# Patient Record
Sex: Female | Born: 1935 | Race: White | Hispanic: No | Smoking: Former smoker
Health system: Southern US, Community
[De-identification: ages and names within clinical notes are randomized; demographics above are authoritative.]

## PROBLEM LIST (undated history)

## (undated) DIAGNOSIS — I509 Heart failure, unspecified: Secondary | ICD-10-CM

## (undated) DIAGNOSIS — J8410 Pulmonary fibrosis, unspecified: Secondary | ICD-10-CM

## (undated) DIAGNOSIS — E119 Type 2 diabetes mellitus without complications: Secondary | ICD-10-CM

## (undated) DIAGNOSIS — I10 Essential (primary) hypertension: Secondary | ICD-10-CM

## (undated) DIAGNOSIS — E039 Hypothyroidism, unspecified: Secondary | ICD-10-CM

## (undated) HISTORY — PX: ABDOMINAL HYSTERECTOMY: SHX81

## (undated) HISTORY — PX: REPLACEMENT TOTAL KNEE: SUR1224

---

## 2017-01-10 ENCOUNTER — Inpatient Hospital Stay (HOSPITAL_COMMUNITY)
Admission: EM | Admit: 2017-01-10 | Discharge: 2017-01-12 | DRG: 196 | Disposition: A | Discharge status: Hospice | Payer: Medicare Other | Attending: Family Medicine | Admitting: Family Medicine

## 2017-01-10 ENCOUNTER — Encounter (HOSPITAL_COMMUNITY): Payer: Self-pay | Admitting: *Deleted

## 2017-01-10 ENCOUNTER — Emergency Department (HOSPITAL_COMMUNITY): Payer: Medicare Other

## 2017-01-10 DIAGNOSIS — J84112 Idiopathic pulmonary fibrosis: Principal | ICD-10-CM | POA: Diagnosis present

## 2017-01-10 DIAGNOSIS — Z79899 Other long term (current) drug therapy: Secondary | ICD-10-CM

## 2017-01-10 DIAGNOSIS — R0600 Dyspnea, unspecified: Secondary | ICD-10-CM | POA: Diagnosis not present

## 2017-01-10 DIAGNOSIS — E119 Type 2 diabetes mellitus without complications: Secondary | ICD-10-CM

## 2017-01-10 DIAGNOSIS — Z836 Family history of other diseases of the respiratory system: Secondary | ICD-10-CM | POA: Diagnosis not present

## 2017-01-10 DIAGNOSIS — Z515 Encounter for palliative care: Secondary | ICD-10-CM

## 2017-01-10 DIAGNOSIS — E1165 Type 2 diabetes mellitus with hyperglycemia: Secondary | ICD-10-CM | POA: Diagnosis present

## 2017-01-10 DIAGNOSIS — F419 Anxiety disorder, unspecified: Secondary | ICD-10-CM | POA: Diagnosis present

## 2017-01-10 DIAGNOSIS — I10 Essential (primary) hypertension: Secondary | ICD-10-CM | POA: Diagnosis present

## 2017-01-10 DIAGNOSIS — J8410 Pulmonary fibrosis, unspecified: Secondary | ICD-10-CM | POA: Diagnosis present

## 2017-01-10 DIAGNOSIS — J9621 Acute and chronic respiratory failure with hypoxia: Secondary | ICD-10-CM | POA: Diagnosis not present

## 2017-01-10 DIAGNOSIS — E039 Hypothyroidism, unspecified: Secondary | ICD-10-CM

## 2017-01-10 DIAGNOSIS — R0902 Hypoxemia: Secondary | ICD-10-CM | POA: Diagnosis not present

## 2017-01-10 DIAGNOSIS — Z7952 Long term (current) use of systemic steroids: Secondary | ICD-10-CM

## 2017-01-10 DIAGNOSIS — Z888 Allergy status to other drugs, medicaments and biological substances status: Secondary | ICD-10-CM

## 2017-01-10 DIAGNOSIS — Z9981 Dependence on supplemental oxygen: Secondary | ICD-10-CM

## 2017-01-10 DIAGNOSIS — I509 Heart failure, unspecified: Secondary | ICD-10-CM | POA: Diagnosis present

## 2017-01-10 DIAGNOSIS — I110 Hypertensive heart disease with heart failure: Secondary | ICD-10-CM | POA: Diagnosis present

## 2017-01-10 DIAGNOSIS — Z66 Do not resuscitate: Secondary | ICD-10-CM | POA: Diagnosis present

## 2017-01-10 HISTORY — DX: Type 2 diabetes mellitus without complications: E11.9

## 2017-01-10 HISTORY — DX: Pulmonary fibrosis, unspecified: J84.10

## 2017-01-10 HISTORY — DX: Hypothyroidism, unspecified: E03.9

## 2017-01-10 HISTORY — DX: Heart failure, unspecified: I50.9

## 2017-01-10 HISTORY — DX: Essential (primary) hypertension: I10

## 2017-01-10 LAB — COMPREHENSIVE METABOLIC PANEL
ALK PHOS: 64 U/L (ref 38–126)
ALT: 18 U/L (ref 14–54)
AST: 24 U/L (ref 15–41)
Albumin: 3.1 g/dL — ABNORMAL LOW (ref 3.5–5.0)
Anion gap: 10 (ref 5–15)
BILIRUBIN TOTAL: 0.4 mg/dL (ref 0.3–1.2)
BUN: 23 mg/dL — ABNORMAL HIGH (ref 6–20)
CALCIUM: 9.4 mg/dL (ref 8.9–10.3)
CO2: 22 mmol/L (ref 22–32)
CREATININE: 0.9 mg/dL (ref 0.44–1.00)
Chloride: 105 mmol/L (ref 101–111)
GFR calc Af Amer: >60 mL/min (ref >60)
GFR calc non Af Amer: 58 mL/min — ABNORMAL LOW (ref >60)
Glucose, Bld: 175 mg/dL — ABNORMAL HIGH (ref 65–99)
Potassium: 3.6 mmol/L (ref 3.5–5.1)
Sodium: 137 mmol/L (ref 135–145)
TOTAL PROTEIN: 7.4 g/dL (ref 6.5–8.1)

## 2017-01-10 LAB — CBC WITH DIFFERENTIAL/PLATELET
BASOS ABS: 0.1 10*3/uL (ref 0.0–0.1)
BASOS PCT: 0 %
EOS ABS: 0.7 10*3/uL (ref 0.0–0.7)
Eosinophils Relative: 5 %
HEMATOCRIT: 37.7 % (ref 36.0–46.0)
HEMOGLOBIN: 12.5 g/dL (ref 12.0–15.0)
Lymphocytes Relative: 13 %
Lymphs Abs: 1.8 10*3/uL (ref 0.7–4.0)
MCH: 30.3 pg (ref 26.0–34.0)
MCHC: 33.2 g/dL (ref 30.0–36.0)
MCV: 91.5 fL (ref 78.0–100.0)
MONO ABS: 1.2 10*3/uL — AB (ref 0.1–1.0)
MONOS PCT: 9 %
NEUTROS ABS: 9.8 10*3/uL — AB (ref 1.7–7.7)
NEUTROS PCT: 73 %
Platelets: 260 10*3/uL (ref 150–400)
RBC: 4.12 MIL/uL (ref 3.87–5.11)
RDW: 14.5 % (ref 11.5–15.5)
WBC: 13.5 10*3/uL — ABNORMAL HIGH (ref 4.0–10.5)

## 2017-01-10 LAB — GLUCOSE, CAPILLARY: Glucose-Capillary: 151 mg/dL — ABNORMAL HIGH (ref 65–99)

## 2017-01-10 MED ORDER — LATANOPROST 0.005 % OP SOLN
1.0000 [drp] | Freq: Every day | OPHTHALMIC | Status: DC
  Administered 2017-01-10 – 2017-01-11 (×2): 1 [drp] via OPHTHALMIC
  Filled 2017-01-10: qty 2.5

## 2017-01-10 MED ORDER — LEVOTHYROXINE SODIUM 50 MCG PO TABS
75.0000 ug | ORAL_TABLET | Freq: Every day | ORAL | Status: DC
  Administered 2017-01-11 – 2017-01-12 (×2): 75 ug via ORAL
  Filled 2017-01-10 (×2): qty 1

## 2017-01-10 MED ORDER — MOMETASONE FURO-FORMOTEROL FUM 200-5 MCG/ACT IN AERO
2.0000 | INHALATION_SPRAY | Freq: Two times a day (BID) | RESPIRATORY_TRACT | Status: DC
  Administered 2017-01-10 – 2017-01-12 (×4): 2 via RESPIRATORY_TRACT
  Filled 2017-01-10: qty 8.8

## 2017-01-10 MED ORDER — INSULIN ASPART 100 UNIT/ML ~~LOC~~ SOLN: 0.0000 [IU] | Freq: Every day | SUBCUTANEOUS | Status: DC

## 2017-01-10 MED ORDER — IPRATROPIUM-ALBUTEROL 0.5-2.5 (3) MG/3ML IN SOLN
3.0000 mL | RESPIRATORY_TRACT | Status: DC
  Administered 2017-01-10 – 2017-01-11 (×4): 3 mL via RESPIRATORY_TRACT
  Filled 2017-01-10 (×5): qty 3

## 2017-01-10 MED ORDER — ALLOPURINOL 100 MG PO TABS
100.0000 mg | ORAL_TABLET | Freq: Every day | ORAL | Status: DC
  Administered 2017-01-11 – 2017-01-12 (×2): 100 mg via ORAL
  Filled 2017-01-10 (×3): qty 1

## 2017-01-10 MED ORDER — MORPHINE SULFATE 10 MG/5ML PO SOLN
4.0000 mg | Freq: Once | ORAL | Status: AC
  Administered 2017-01-11: 4 mg via ORAL
  Filled 2017-01-10: qty 5

## 2017-01-10 MED ORDER — LEVOFLOXACIN 750 MG PO TABS
750.0000 mg | ORAL_TABLET | Freq: Once | ORAL | Status: AC
  Administered 2017-01-10: 750 mg via ORAL
  Filled 2017-01-10: qty 1

## 2017-01-10 MED ORDER — ACETAMINOPHEN 650 MG RE SUPP: 650.0000 mg | Freq: Four times a day (QID) | RECTAL | Status: DC | PRN

## 2017-01-10 MED ORDER — ONDANSETRON HCL 4 MG/2ML IJ SOLN: 4.0000 mg | Freq: Four times a day (QID) | INTRAMUSCULAR | Status: DC | PRN

## 2017-01-10 MED ORDER — LOSARTAN POTASSIUM 50 MG PO TABS
100.0000 mg | ORAL_TABLET | Freq: Every day | ORAL | Status: DC
  Administered 2017-01-11 – 2017-01-12 (×2): 100 mg via ORAL
  Filled 2017-01-10 (×2): qty 2

## 2017-01-10 MED ORDER — IOPAMIDOL (ISOVUE-370) INJECTION 76%
INTRAVENOUS | Status: AC
  Administered 2017-01-10: 100 mL via INTRAVENOUS
  Filled 2017-01-10: qty 100

## 2017-01-10 MED ORDER — SERTRALINE HCL 50 MG PO TABS
50.0000 mg | ORAL_TABLET | Freq: Every day | ORAL | Status: DC
  Administered 2017-01-11 – 2017-01-12 (×2): 50 mg via ORAL
  Filled 2017-01-10 (×2): qty 1

## 2017-01-10 MED ORDER — ONDANSETRON HCL 4 MG PO TABS: 4.0000 mg | ORAL_TABLET | Freq: Four times a day (QID) | ORAL | Status: DC | PRN

## 2017-01-10 MED ORDER — AMLODIPINE BESYLATE 5 MG PO TABS
5.0000 mg | ORAL_TABLET | Freq: Every day | ORAL | Status: DC
  Administered 2017-01-10 – 2017-01-12 (×3): 5 mg via ORAL
  Filled 2017-01-10 (×3): qty 1

## 2017-01-10 MED ORDER — LORAZEPAM 0.5 MG PO TABS
0.5000 mg | ORAL_TABLET | Freq: Four times a day (QID) | ORAL | Status: DC
  Administered 2017-01-10 – 2017-01-12 (×6): 0.5 mg via ORAL
  Filled 2017-01-10 (×7): qty 1

## 2017-01-10 MED ORDER — ALPRAZOLAM 0.5 MG PO TABS
0.5000 mg | ORAL_TABLET | Freq: Once | ORAL | Status: AC
  Administered 2017-01-10: 0.5 mg via ORAL
  Filled 2017-01-10: qty 1

## 2017-01-10 MED ORDER — PREDNISONE 20 MG PO TABS
60.0000 mg | ORAL_TABLET | Freq: Once | ORAL | Status: AC
  Administered 2017-01-10: 60 mg via ORAL
  Filled 2017-01-10: qty 3

## 2017-01-10 MED ORDER — TIMOLOL MALEATE 0.5 % OP SOLN
1.0000 [drp] | Freq: Two times a day (BID) | OPHTHALMIC | Status: DC
  Administered 2017-01-10 – 2017-01-12 (×4): 1 [drp] via OPHTHALMIC
  Filled 2017-01-10: qty 5

## 2017-01-10 MED ORDER — ACETAMINOPHEN 325 MG PO TABS: 650.0000 mg | ORAL_TABLET | Freq: Four times a day (QID) | ORAL | Status: DC | PRN

## 2017-01-10 MED ORDER — DOCUSATE SODIUM 100 MG PO CAPS
100.0000 mg | ORAL_CAPSULE | Freq: Two times a day (BID) | ORAL | Status: DC
  Filled 2017-01-10: qty 1

## 2017-01-10 MED ORDER — ENOXAPARIN SODIUM 40 MG/0.4ML ~~LOC~~ SOLN
40.0000 mg | Freq: Every day | SUBCUTANEOUS | Status: DC
  Administered 2017-01-10: 40 mg via SUBCUTANEOUS
  Filled 2017-01-10: qty 0.4

## 2017-01-10 MED ORDER — INSULIN ASPART 100 UNIT/ML ~~LOC~~ SOLN
0.0000 [IU] | Freq: Three times a day (TID) | SUBCUTANEOUS | Status: DC
  Administered 2017-01-11: 5 [IU] via SUBCUTANEOUS
  Administered 2017-01-11 (×2): 3 [IU] via SUBCUTANEOUS
  Administered 2017-01-12: 5 [IU] via SUBCUTANEOUS

## 2017-01-10 MED ORDER — METHYLPREDNISOLONE SODIUM SUCC 125 MG IJ SOLR
60.0000 mg | Freq: Two times a day (BID) | INTRAMUSCULAR | Status: DC
  Administered 2017-01-10 – 2017-01-12 (×4): 60 mg via INTRAVENOUS
  Filled 2017-01-10 (×4): qty 2

## 2017-01-10 MED ORDER — ALBUTEROL SULFATE (2.5 MG/3ML) 0.083% IN NEBU: 2.5000 mg | INHALATION_SOLUTION | RESPIRATORY_TRACT | Status: DC | PRN

## 2017-01-10 MED ORDER — LORAZEPAM 2 MG/ML IJ SOLN
1.0000 mg | INTRAMUSCULAR | Status: DC | PRN
  Administered 2017-01-12: 1 mg via INTRAVENOUS
  Filled 2017-01-10: qty 1

## 2017-01-10 NOTE — Assessment & Plan Note (Signed)
She gets significantly dyspneic even talking. Even at rest she is class IV dyspnea. I will give her a test dose of morphine 4 mg syrup. She is completely on board with palliative care and possibly going to hospice. She is fully aware of the terminal nature of this disease especially after having lost her daughter to the same disease.

## 2017-01-10 NOTE — Consult Note (Signed)
PULMONARY / CRITICAL CARE MEDICINE   Name: Janet Cooke MRN: [id number] DOB: 1932/03/30 PCP No PCP Per Patient LOS 0 as of 01/10/2017     ADMISSION DATE:  01/10/2017 CONSULTATION DATE:  01/10/17  REFERRING MD:  Dr Victorino Dike [facility]  CHIEF COMPLAINT:  IPF, cjhronc hypoxemic resp failure  HISTORY OF PRESENT ILLNESS:   83 year old lady who has recently moved from [place] for worsening pulmonary fibrosis so that she can be with her daughter Janet Cooke and get the care she needs. History is obtained from review of the chart, outside records from [facility] provided by the daughter and by the patient herself.  Patient has a strong family history of pulmonary fibrosis. Her brother who is still alive has pulmonary fibrosis. Her daughter Janet Cooke date of birth 05/06/1957 was under the care of Dr. Shan Levans and deceased in 02/13/13 from pulmonary fibrosis. Patient believes the daughter had IPF but review of the chart shows that she had DIP although it is not clear if the Alnisa Hasley had a biopsy or not.  Patient Janet Cooke herself was given a diagnosis of IPF according to her history in November 2000 610. She does not recollect any biopsy. She does not recollect any autoimmune panel. She is a remote smoker having quit 20 years ago. She was initially started on Ofev but this gave her bad diarrhea and she stopped this by the spring of 2017. Subsequently it was a follow-up 2017-02-13 she was started on Pirfenidone (Esbriet) but she had to discontinue this in March 2018 because of side effects related to the GI system. At the time of diagnosis she was on as needed oxygen and requiring 2 L of oxygen continuously as of one year ago but this has progressed and requiring 4-5 liters of oxygen as of the last 1 month prior to move from [place] to [facility].  She has upcoming first consultation appointment with Dr. Marchelle Gearing for IPF care on 02/04/2017 but in the interim has been hospitalized  because of progressive dyspnea and class IV dyspnea and significant hypoxemia with desaturations to the 70s despite 5 L of oxygen which is the maximum of oxygen at home. This no fever edema or chills or chest pain. She and her daughter primarily interested in palliation and genetic support and counseling. Of note she has participated in some kind of her genetic study in [place] not otherwise specified.  PAST MEDICAL HISTORY :  She  has a past medical history of CHF (congestive heart failure) (HCC); Diabetes mellitus without complication (HCC); Hypertension; Hypothyroidism; and Pulmonary fibrosis (HCC).  PAST SURGICAL HISTORY: She  has a past surgical history that includes Replacement total knee and Abdominal hysterectomy.  Allergies  Allergen Reactions  . Lipitor [Atorvastatin] Other (See Comments)    Muscle pain  . Lisinopril     Cough    No current facility-administered medications on file prior to encounter.    No current outpatient prescriptions on file prior to encounter.    FAMILY HISTORY:  Her indicated that her brother is alive. She indicated that her daughter is deceased.    SOCIAL HISTORY: She  reports that she has quit smoking. Her smoking use included Cigarettes. She has a 35.00 pack-year smoking history. She has quit using smokeless tobacco. She reports that she drinks alcohol. She reports that she does not use drugs.  REVIEW OF SYSTEMS:   This is detailed in the history of present illness otherwise not elicitable due to significant dyspnea for  the patient   VITAL SIGNS: BP (!) (P) 130/56 (BP Location: Left Arm)   Pulse (P) 66   Temp (P) 97.6 F (36.4 C) (Oral)   Resp (!) (P) 24   Ht  (1.6 m)   Wt 68 kg (150 lb)   SpO2 94%   BMI 26.57 kg/m   HEMODYNAMICS:    VENTILATOR SETTINGS:    INTAKE / OUTPUT: No intake/output data recorded.     EXAM  General Appearance:    Looks Chronically unwell   Head:    Normocephalic, without obvious abnormality,  atraumatic  Eyes:    PERRL - Yes, conjunctiva/corneas - clear      Ears:    Normal external ear canals, both ears  Nose:   NG tube - No   Throat:  ETT TUBE - No , OG tube - no   Neck:   Supple,  No enlargement/tenderness/nodules     Lungs:     Class IV dyspnea on talking. Nasal cannula oxygen on. Quite tachypneic which gets worse with talking. Rapid shallow breathing. Unable to complete sentences   Chest wall:    No deformity  Heart:    S1 and S2 normal, no murmur, CVP - No.  Pressors - no   Abdomen:     Soft, no masses, no organomegaly  Genitalia:    Not done  Rectal:   not done  Extremities:   Extremities- No cyanosis, no clubbing no edema      Skin:   Intact in exposed areas . Sacral area - No reports of sacral decub      Neurologic:   Sedation - None -> RASS - +1 . Moves all 4s - yes. CAM-ICU - negative for delirium . Orientation - 3+        LABS  PULMONARY No results for input(s): PHART, PCO2ART, PO2ART, HCO3, TCO2, O2SAT in the last 168 hours.  Invalid input(s): PCO2, PO2  CBC  Recent Labs Lab 01/10/17 1637  HGB 12.5  HCT 37.7  WBC 13.5*  PLT 260    COAGULATION No results for input(s): INR in the last 168 hours.  CARDIAC  No results for input(s): TROPONINI in the last 168 hours. No results for input(s): PROBNP in the last 168 hours.   CHEMISTRY  Recent Labs Lab 01/10/17 1637  NA 137  K 3.6  CL 105  CO2 22  GLUCOSE 175*  BUN 23*  CREATININE 0.90  CALCIUM 9.4   Estimated Creatinine Clearance: 44.6 mL/min (by C-G formula based on SCr of 0.9 mg/dL).   LIVER  Recent Labs Lab 01/10/17 1637  AST 24  ALT 18  ALKPHOS 64  BILITOT 0.4  PROT 7.4  ALBUMIN 3.1*     INFECTIOUS No results for input(s): LATICACIDVEN, PROCALCITON in the last 168 hours.   ENDOCRINE CBG (last 3)   Recent Labs  01/10/17 2221  GLUCAP 151*         IMAGING x48h  - image(s) personally visualized  -   highlighted in bold Dg Chest 2 View  Result Date:  01/10/2017 CLINICAL DATA:  Shortness of Breath EXAM: CHEST  2 VIEW COMPARISON:  None. FINDINGS: Cardiac shadow is at the upper limits of normal in size. Diffuse fibrotic changes are identified bilaterally likely of a chronic nature and consistent with the patient's given clinical history. No definitive consolidation to suggest acute infiltrate is noted. No bony abnormality is seen. IMPRESSION: Diffuse fibrotic changes which are likely chronic given the patient's clinical  history. Electronically Signed   By: Alcide Clever M.D.   On: 01/10/2017 16:46   Ct Angio Chest Pe W And/or Wo Contrast  Result Date: 01/10/2017 CLINICAL DATA:  Shortness of breath for 2 years, pulmonary fibrosis, COPD EXAM: CT ANGIOGRAPHY CHEST WITH CONTRAST TECHNIQUE: Multidetector CT imaging of the chest was performed using the standard protocol during bolus administration of intravenous contrast. Multiplanar CT image reconstructions and MIPs were obtained to evaluate the vascular anatomy. CONTRAST:  100 cc Isovue COMPARISON:  None. FINDINGS: Cardiovascular: Cardiomegaly is noted. No pericardial effusion. No pulmonary embolus is noted. There is no aortic aneurysm. Atherosclerotic calcifications of thoracic aorta and coronary arteries. Small hiatal hernia. Mediastinum/Nodes: A precarinal lymph node Measures 1.1 cm short-axis. AP window lymph node measures 1.2 cm short-axis. Left anterior mediastinal lymph node just lateral to pulmonary artery measures 1 cm short-axis. Right hilar lymph node Measures 1 cm short-axis. Left hilar lymph node Measures 1 cm short-axis. These are borderline enlarged by size criteria probable reactive. Central airways are patent. Lungs/Pleura: There is bilateral chronic interstitial lung disease with significant cystic changes and peribronchovascular interstitial thickening. Peripheral fibrotic changes with honeycombing are noted bilateral upper lobes and lower lobes. There is thickening of interlobular septa bilateral  peripheral. No definite superimposed infiltrate or pulmonary edema. Findings probable due to chronic UIP and fibrotic changes. Upper Abdomen: The visualized upper abdomen shows no adrenal gland mass. Visualized upper kidneys are unremarkable. Liver and spleen are unremarkable. Musculoskeletal: No destructive bony lesions are noted. Sagittal images of the spine are mild degenerative changes mid and lower thoracic spine Review of the MIP images confirms the above findings. IMPRESSION: 1. No pulmonary embolus is noted. 2. Probable chronic extensive interstitial lung disease bilaterally. Bilateral cystic and fibrotic changes. No definite superimposed acute infiltrate or pulmonary edema. 3. There is bilateral borderline mediastinal and hilar adenopathy probable reactive in nature. No definite pathologic lymph nodes are noted. 4. Cardiomegaly is noted. Atherosclerotic calcifications of thoracic aorta and coronary arteries. Electronically Signed   By: Natasha Mead M.D.   On: 01/10/2017 19:43       ASSESSMENT and PLAN  Acute on chronic respiratory failure with hypoxemia (HCC) I do not think the worsening is due to IPF flare. On the other hand I think this is due to progressive IPF. At this point in time supportive oxygen therapy but we are going to reach limits of a much oxygen we can deliver. I think we should look at symptoms and just except low pulse oximetry goal say greater than 75 or 80% as much as she can tolerate  Dyspnea She gets significantly dyspneic even talking. Even at rest she is class IV dyspnea. I will give her a test dose of morphine 4 mg syrup. She is completely on board with palliative care and possibly going to hospice. She is fully aware of the terminal nature of this disease especially after having lost her daughter to the same disease.  IPF (idiopathic pulmonary fibrosis) (HCC) She has failed both Pirfenidone (Esbriet) and Ofev. The diseases and at end-stage. To me the CT looks classic  IPF and based on the age and family history this is definite of IPF. For completion sake I will get limited autoimmune profile of ANA, rheumatoid factor and CCP. Regardless the prognosis is less than 6 months and she is hospice eligible  She is very interested in participating in research trials. I will ask pulmonary research coordinator Carron Curie to evaluate her for the [facility] IPF registry  noninterventional trial. Patient and her daughter are very interested in this.  Family history of pulmonary fibrosis I have restarted [name] genetics counselor within [place] to see if he can help daughter Janet Cooke with IPF genetics.. Meanwhile daughter will try to obtain information about the genetic test that was done in [place] for the patient      FAMILY  - Updates: 01/10/2017 --> daughter and the patient at the bedside  - Inter-disciplinary family meet or Palliative Care meeting due by:  DAy 7. Current LOS is LOS 0 days  CODE STATUS    Code Status Orders        Start     Ordered   01/10/17 2144  Do not attempt resuscitation (DNR)  Continuous    Question Answer Comment  In the event of cardiac or respiratory ARREST Do not call a "code blue"   In the event of cardiac or respiratory ARREST Do not perform Intubation, CPR, defibrillation or ACLS   In the event of cardiac or respiratory ARREST Use medication by any route, position, wound care, and other measures to relive pain and suffering. May use oxygen, suction and manual treatment of airway obstruction as needed for comfort.      01/10/17 2143    Code Status History    Date Active Date Inactive Code Status Order ID Comments User Context   01/10/2017  5:13 PM 01/10/2017  9:43 PM DNR [id number]  Marily Memos, MD ED    Advance Directive Documentation     Most Recent Value  Type of Advance Directive  Healthcare Power of Attorney, Living will  Pre-existing out of facility DNR order (yellow form or pink MOST form)  -  "MOST" Form in  Place?  -        DISPO Medical floor      Dr. Kalman Shan, M.D., [facility].C.P Pulmonary and Critical Care Medicine Staff Physician [facility] Pager: [phone], If no answer or between  15:00h - 7:00h: call [phone]  01/10/2017 11:25 PM

## 2017-01-10 NOTE — Assessment & Plan Note (Signed)
I have restarted Janet Cooke genetics counselor within  to see if he can help daughter Janet Cooke with IPF genetics.. Meanwhile daughter will try to obtain information about the genetic test that was done in [place] for the patient

## 2017-01-10 NOTE — ED Provider Notes (Signed)
[facility]-EMERGENCY DEPT Provider Note   CSN: [id number] Arrival date & time: 01/10/17  1550     History   Chief Complaint Chief Complaint  Patient presents with  . Shortness of Breath    HPI Janet Cooke is a 83 y.o. female.   Shortness of Breath  This is a new problem. The problem occurs rarely.The problem has not changed since onset.Associated symptoms include cough. Pertinent negatives include no fever, no headaches, no hemoptysis and no wheezing. The problem's precipitants include pollens.    Past Medical History:  Diagnosis Date  . CHF (congestive heart failure) (HCC)    uncertain if systolic or diastolic  . Diabetes mellitus without complication (HCC)   . Hypertension   . Hypothyroidism   . Pulmonary fibrosis [facility])     Patient Active Problem List   Diagnosis Date Noted  . Palliative care by specialist   . DNR (do not resuscitate)   . Diabetes mellitus type 2 in nonobese (HCC) 01/10/2017  . Essential hypertension 01/10/2017  . Hypothyroidism 01/10/2017  . Acute on chronic respiratory failure with hypoxemia (HCC) 01/10/2017  . IPF (idiopathic pulmonary fibrosis) (HCC) 01/10/2017  . Dyspnea 01/10/2017  . Family history of pulmonary fibrosis 01/10/2017    Past Surgical History:  Procedure Laterality Date  . ABDOMINAL HYSTERECTOMY    . REPLACEMENT TOTAL KNEE      OB History    No data available       Home Medications    Prior to Admission medications   Medication Sig Start Date End Date Taking? Authorizing Provider  albuterol (VENTOLIN HFA) 108 (90 Base) MCG/ACT inhaler Inhale 2 puffs into the lungs every 4 (four) hours as needed for wheezing.   Yes Historical Provider, MD  allopurinol (ZYLOPRIM) 100 MG tablet Take 100 mg by mouth daily.   Yes Historical Provider, MD  amLODipine (NORVASC) 5 MG tablet Take 5 mg by mouth daily.   Yes Historical Provider, MD  budesonide-formoterol (SYMBICORT) 160-4.5 MCG/ACT inhaler Inhale 2 puffs into the lungs 2 (two)  times daily.   Yes Historical Provider, MD  Cholecalciferol (VITAMIN D3) 2000 units TABS Take 2,000 Units by mouth daily.   Yes Historical Provider, MD  furosemide (LASIX) 20 MG tablet Take 20 mg by mouth.   Yes Historical Provider, MD  glimepiride (AMARYL) 4 MG tablet Take 4 mg by mouth daily before breakfast.   Yes Historical Provider, MD  latanoprost (XALATAN) 0.005 % ophthalmic solution Place 1 drop into both eyes at bedtime.   Yes Historical Provider, MD  levothyroxine (SYNTHROID, LEVOTHROID) 75 MCG tablet Take 75 mcg by mouth daily before breakfast.   Yes Historical Provider, MD  linagliptin (TRADJENTA) 5 MG TABS tablet Take 5 mg by mouth daily. With food   Yes Historical Provider, MD  losartan (COZAAR) 100 MG tablet Take 100 mg by mouth daily.   Yes Historical Provider, MD  sertraline (ZOLOFT) 50 MG tablet Take 50 mg by mouth daily.   Yes Historical Provider, MD  timolol (BETIMOL) 0.5 % ophthalmic solution Apply 1 drop to eye 2 (two) times daily.   Yes Historical Provider, MD  tiotropium (SPIRIVA) 18 MCG inhalation capsule Place 18 mcg into inhaler and inhale daily.   Yes Historical Provider, MD  vitamin B-12 (CYANOCOBALAMIN) 500 MCG tablet Take 500 mcg by mouth daily.   Yes Historical Provider, MD    Family History Family History  Problem Relation Age of Onset  . Pulmonary fibrosis Brother     on [place]  .  Pulmonary fibrosis Daughter 12    Social History Social History  Substance Use Topics  . Smoking status: Former Smoker    Packs/day: 1.00    Years: 35.00    Types: Cigarettes  . Smokeless tobacco: Former Neurosurgeon  . Alcohol use Yes     Comment: very little     Allergies   Lipitor [atorvastatin] and Lisinopril   Review of Systems Review of Systems  Constitutional: Negative for fever.  Respiratory: Positive for cough and shortness of breath. Negative for hemoptysis and wheezing.   Neurological: Negative for headaches.  All other systems reviewed and are  negative.    Physical Exam Updated Vital Signs BP 118/74 (BP Location: Right Arm)   Pulse (!) 57   Temp 97.4 F (36.3 C) (Oral)   Resp 18   Ht  (1.6 m)   Wt 150 lb (68 kg)   SpO2 93%   BMI 26.57 kg/m   Physical Exam  Constitutional: She is oriented to person, place, and time. She appears well-developed and well-nourished.  HENT:  Head: Normocephalic and atraumatic.  Eyes: Conjunctivae and EOM are normal.  Neck: Normal range of motion.  Cardiovascular: Normal rate and regular rhythm.   Pulmonary/Chest: Effort normal. No stridor. Tachypnea noted. No respiratory distress. She has no wheezes. She has rales (mild diffuse).  Abdominal: Soft. She exhibits no distension.  Musculoskeletal: Normal range of motion. She exhibits no edema or deformity.  Neurological: She is alert and oriented to person, place, and time. No cranial nerve deficit. Coordination normal.  Skin: Skin is warm and dry.  Nursing note and vitals reviewed.    ED Treatments / Results  Labs (all labs ordered are listed, but only abnormal results are displayed) Labs Reviewed  COMPREHENSIVE METABOLIC PANEL - Abnormal; Notable for the following:       Result Value   Glucose, Bld 175 (*)    BUN 23 (*)    Albumin 3.1 (*)    GFR calc non Af Amer 58 (*)    All other components within normal limits  CBC WITH DIFFERENTIAL/PLATELET - Abnormal; Notable for the following:    WBC 13.5 (*)    Neutro Abs 9.8 (*)    Monocytes Absolute 1.2 (*)    All other components within normal limits  BASIC METABOLIC PANEL - Abnormal; Notable for the following:    Glucose, Bld 219 (*)    BUN 23 (*)    GFR calc non Af Amer 51 (*)    GFR calc Af Amer 59 (*)    All other components within normal limits  CBC - Abnormal; Notable for the following:    RBC 3.64 (*)    Hemoglobin 10.6 (*)    HCT 32.7 (*)    All other components within normal limits  GLUCOSE, CAPILLARY - Abnormal; Notable for the following:    Glucose-Capillary 151  (*)    All other components within normal limits  GLUCOSE, CAPILLARY - Abnormal; Notable for the following:    Glucose-Capillary 215 (*)    All other components within normal limits  GLUCOSE, CAPILLARY - Abnormal; Notable for the following:    Glucose-Capillary 185 (*)    All other components within normal limits  ANTINUCLEAR ANTIBODIES, IFA  RHEUMATOID FACTOR  CYCLIC CITRUL PEPTIDE ANTIBODY, IGG/IGA    EKG  EKG Interpretation  Date/Time:  Sunday January 10 2017 16:18:10 EDT Ventricular Rate:  94 PR Interval:    QRS Duration: 103 QT Interval:  361 QTC Calculation:  452 R Axis:   -61 Text Interpretation:  Sinus rhythm Multiple premature complexes, vent & supraven Left anterior fascicular block Abnormal R-wave progression, late transition Confirmed by [facility] MD, Barbara Cower [phone]) on 01/10/2017 6:38:21 PM       Radiology Dg Chest 2 View  Result Date: 01/10/2017 CLINICAL DATA:  Shortness of Breath EXAM: CHEST  2 VIEW COMPARISON:  None. FINDINGS: Cardiac shadow is at the upper limits of normal in size. Diffuse fibrotic changes are identified bilaterally likely of a chronic nature and consistent with the patient's given clinical history. No definitive consolidation to suggest acute infiltrate is noted. No bony abnormality is seen. IMPRESSION: Diffuse fibrotic changes which are likely chronic given the patient's clinical history. Electronically Signed   By: Alcide Clever M.D.   On: 01/10/2017 16:46   Ct Angio Chest Pe W And/or Wo Contrast  Result Date: 01/10/2017 CLINICAL DATA:  Shortness of breath for 2 years, pulmonary fibrosis, COPD EXAM: CT ANGIOGRAPHY CHEST WITH CONTRAST TECHNIQUE: Multidetector CT imaging of the chest was performed using the standard protocol during bolus administration of intravenous contrast. Multiplanar CT image reconstructions and MIPs were obtained to evaluate the vascular anatomy. CONTRAST:  100 cc Isovue COMPARISON:  None. FINDINGS: Cardiovascular: Cardiomegaly is  noted. No pericardial effusion. No pulmonary embolus is noted. There is no aortic aneurysm. Atherosclerotic calcifications of thoracic aorta and coronary arteries. Small hiatal hernia. Mediastinum/Nodes: A precarinal lymph node Measures 1.1 cm short-axis. AP window lymph node measures 1.2 cm short-axis. Left anterior mediastinal lymph node just lateral to pulmonary artery measures 1 cm short-axis. Right hilar lymph node Measures 1 cm short-axis. Left hilar lymph node Measures 1 cm short-axis. These are borderline enlarged by size criteria probable reactive. Central airways are patent. Lungs/Pleura: There is bilateral chronic interstitial lung disease with significant cystic changes and peribronchovascular interstitial thickening. Peripheral fibrotic changes with honeycombing are noted bilateral upper lobes and lower lobes. There is thickening of interlobular septa bilateral peripheral. No definite superimposed infiltrate or pulmonary edema. Findings probable due to chronic UIP and fibrotic changes. Upper Abdomen: The visualized upper abdomen shows no adrenal gland mass. Visualized upper kidneys are unremarkable. Liver and spleen are unremarkable. Musculoskeletal: No destructive bony lesions are noted. Sagittal images of the spine are mild degenerative changes mid and lower thoracic spine Review of the MIP images confirms the above findings. IMPRESSION: 1. No pulmonary embolus is noted. 2. Probable chronic extensive interstitial lung disease bilaterally. Bilateral cystic and fibrotic changes. No definite superimposed acute infiltrate or pulmonary edema. 3. There is bilateral borderline mediastinal and hilar adenopathy probable reactive in nature. No definite pathologic lymph nodes are noted. 4. Cardiomegaly is noted. Atherosclerotic calcifications of thoracic aorta and coronary arteries. Electronically Signed   By: Natasha Mead M.D.   On: 01/10/2017 19:43    Procedures Procedures (including critical care  time)  Medications Ordered in ED Medications  ipratropium-albuterol (DUONEB) 0.5-2.5 (3) MG/3ML nebulizer solution 3 mL (3 mLs Nebulization Not Given 01/11/17 1600)  allopurinol (ZYLOPRIM) tablet 100 mg (100 mg Oral Given 01/11/17 1028)  amLODipine (NORVASC) tablet 5 mg (5 mg Oral Given 01/11/17 1029)  mometasone-formoterol (DULERA) 200-5 MCG/ACT inhaler 2 puff (2 puffs Inhalation Given 01/11/17 0854)  latanoprost (XALATAN) 0.005 % ophthalmic solution 1 drop (1 drop Both Eyes Given 01/10/17 2304)  levothyroxine (SYNTHROID, LEVOTHROID) tablet 75 mcg (75 mcg Oral Given 01/11/17 0750)  losartan (COZAAR) tablet 100 mg (100 mg Oral Given 01/11/17 1031)  sertraline (ZOLOFT) tablet 50 mg (50 mg Oral Given 01/11/17  1033)  timolol (TIMOPTIC) 0.5 % ophthalmic solution 1 drop (1 drop Both Eyes Given 01/11/17 1034)  acetaminophen (TYLENOL) tablet 650 mg (not administered)    Or  acetaminophen (TYLENOL) suppository 650 mg (not administered)  docusate sodium (COLACE) capsule 100 mg (100 mg Oral Not Given 01/11/17 1029)  ondansetron (ZOFRAN) tablet 4 mg (not administered)    Or  ondansetron (ZOFRAN) injection 4 mg (not administered)  insulin aspart (novoLOG) injection 0-15 Units (3 Units Subcutaneous Given 01/11/17 1300)  insulin aspart (novoLOG) injection 0-5 Units (0 Units Subcutaneous Not Given 01/10/17 2234)  methylPREDNISolone sodium succinate (SOLU-MEDROL) 125 mg/2 mL injection 60 mg (60 mg Intravenous Given 01/11/17 1035)  albuterol (PROVENTIL) (2.5 MG/3ML) 0.083% nebulizer solution 2.5 mg (not administered)  LORazepam (ATIVAN) tablet 0.5 mg (0.5 mg Oral Given 01/11/17 1532)  LORazepam (ATIVAN) injection 1 mg (not administered)  morphine CONCENTRATE 10 MG/0.5ML oral solution 5 mg (not administered)  levofloxacin (LEVAQUIN) tablet 750 mg (750 mg Oral Given 01/10/17 1640)  predniSONE (DELTASONE) tablet 60 mg (60 mg Oral Given 01/10/17 1640)  ALPRAZolam (XANAX) tablet 0.5 mg (0.5 mg Oral Given 01/10/17 1723)   iopamidol (ISOVUE-370) 76 % injection (100 mLs Intravenous Contrast Given 01/10/17 1852)  morphine 10 MG/5ML solution 4 mg (4 mg Oral Given 01/11/17 0010)  morphine CONCENTRATE 10 MG/0.5ML oral solution 5 mg (5 mg Oral Given 01/11/17 1052)     Initial Impression / Assessment and Plan / ED Course  I have reviewed the triage vital signs and the nursing notes.  Pertinent labs & imaging results that were available during my care of the patient were reviewed by me and considered in my medical decision making (see chart for details).     Acute on chronic respiratory failure likely secondary to IBS. Patient is willing to pursue palliative care was not able to turn her oxygen up past 5 L on her current machine. She patient get a new machine later in the week but is too hypoxic on the one that she has. She'll desat into the 80s with ambulation and takes 3045 minutes to get back above 90 and become comfortable again. Secondary to these issues I will discuss with hospitalist about admission for positive care consult and to help ensure the patient has a safe discharge plan.  Final Clinical Impressions(s) / ED Diagnoses   Final diagnoses:  Hypoxia  Pulmonary fibrosis (HCC)      Marily Memos, MD 01/11/17 [phone]

## 2017-01-10 NOTE — H&P (Signed)
History and Physical    Janet Cooke:[id number] DOB: December 24, 1931 DOA: 01/10/2017  PCP: No PCP Per Patient - PCP is in [place], unsure if she will be staying here in [place] or returning to [place] Consultants:  Pulmonologist and cardiologist in [place] Patient coming from: visiting daughter; Janet Cooke: daughter, [phone]  Chief Complaint: SOB  HPI: Janet Cooke is a 83 y.o. female with medical history significant of end-stage pulmonary fibrosis, HTN, DM, and CHF (no further information about systolic/diastolic) presenting with SOB.  Patient with pulmonary fibrosis x 2 years.  She had been doing well with supplemental O2 prn, mostly at night.  Flew up to [place], [place] at Thanksgiving - had some SOB but seemed to catch up again.  In January, she began feeling SOB chronically and was deteriorating, needing more O2.  Increased from 2L to 3L in February, now at 4L.  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  Her breathing was so labored that her daughter felt like they had to come to the ER.  +cough, productive of clear sputum.  Unsure if she is ill or if this is a progression.  Daughter died of this 4 years ago and her brother has it as well but is doing well "on some fancy drug."  Currently on 8L via [place].  Breathing treatments do seem to be helping.    Feb 28, daughter was with her in [facility] - difficulty catching her breath, sats in 72s.  Hospitalized x 3 days, then rehab x 30 days.  Discharged March 30.  Able to walk 12 feet at discharge.  After discharge, she seemed to be deteriorating fast and could do less daily.  Unable to move from bed to commode without dropping O2 sats 20 points.  Taking too long to try to get O2 back up - 30 minutes to get O2 back up from 70s.  Daughter realized that she was unable to live on her own in this siutation and needs to take care of her, and there is no family in [place].  So daughter brought her to [facility].  Started on Ofev in Nov 2016 and unable to  tolerate it, so violently ill.  Has appointment with Dr. Marchelle Gearing on May 10.   ED Course: Given Prednisone, Levaquin, Xanax, Duoneb  Review of Systems: As per HPI; otherwise review of systems reviewed and negative.   Ambulatory Status:  Only transfers  Past Medical History:  Diagnosis Date  . CHF (congestive heart failure) (HCC)    uncertain if systolic or diastolic  . Diabetes mellitus without complication (HCC)   . Hypertension   . Hypothyroidism   . Pulmonary fibrosis (HCC)     Past Surgical History:  Procedure Laterality Date  . ABDOMINAL HYSTERECTOMY    . REPLACEMENT TOTAL KNEE      Social History   Social History  . Marital status: Widowed    Spouse name: N/A  . Number of children: N/A  . Years of education: N/A   Occupational History  . retired    Social History Main Topics  . Smoking status: Former Smoker    Packs/day: 1.00    Years: 35.00    Types: Cigarettes  . Smokeless tobacco: Former Neurosurgeon  . Alcohol use Yes     Comment: very little  . Drug use: No  . Sexual activity: Not on file   Other Topics Concern  . Not on file   Social History Narrative  .  No narrative on file    Allergies  Allergen Reactions  . Lipitor [Atorvastatin] Other (See Comments)    Muscle pain  . Lisinopril     Cough    Family History  Problem Relation Age of Onset  . Pulmonary fibrosis Brother     on Esbriet  . Pulmonary fibrosis Daughter 43    Prior to Admission medications   Medication Sig Start Date End Date Taking? Authorizing Provider  albuterol (VENTOLIN HFA) 108 (90 Base) MCG/ACT inhaler Inhale 2 puffs into the lungs every 4 (four) hours as needed for wheezing.   Yes Historical Provider, MD  allopurinol (ZYLOPRIM) 100 MG tablet Take 100 mg by mouth daily.   Yes Historical Provider, MD  amLODipine (NORVASC) 5 MG tablet Take 5 mg by mouth daily.   Yes Historical Provider, MD  budesonide-formoterol (SYMBICORT) 160-4.5 MCG/ACT inhaler Inhale 2 puffs into the  lungs 2 (two) times daily.   Yes Historical Provider, MD  Cholecalciferol (VITAMIN D3) 2000 units TABS Take 2,000 Units by mouth daily.   Yes Historical Provider, MD  furosemide (LASIX) 20 MG tablet Take 20 mg by mouth.   Yes Historical Provider, MD  glimepiride (AMARYL) 4 MG tablet Take 4 mg by mouth daily before breakfast.   Yes Historical Provider, MD  latanoprost (XALATAN) 0.005 % ophthalmic solution Place 1 drop into both eyes at bedtime.   Yes Historical Provider, MD  levothyroxine (SYNTHROID, LEVOTHROID) 75 MCG tablet Take 75 mcg by mouth daily before breakfast.   Yes Historical Provider, MD  linagliptin (TRADJENTA) 5 MG TABS tablet Take 5 mg by mouth daily. With food   Yes Historical Provider, MD  losartan (COZAAR) 100 MG tablet Take 100 mg by mouth daily.   Yes Historical Provider, MD  sertraline (ZOLOFT) 50 MG tablet Take 50 mg by mouth daily.   Yes Historical Provider, MD  timolol (BETIMOL) 0.5 % ophthalmic solution Apply 1 drop to eye 2 (two) times daily.   Yes Historical Provider, MD  tiotropium (SPIRIVA) 18 MCG inhalation capsule Place 18 mcg into inhaler and inhale daily.   Yes Historical Provider, MD  vitamin B-12 (CYANOCOBALAMIN) 500 MCG tablet Take 500 mcg by mouth daily.   Yes Historical Provider, MD    Physical Exam: Vitals:   01/10/17 2035 01/10/17 2100 01/10/17 2158 01/10/17 2247  BP: (!) 134/46 132/76 (!) (P) 130/56   Pulse: 71 70 (P) 66   Resp: (!) 23 (!) 21 (!) (P) 24   Temp:   (P) 97.6 F (36.4 C)   TempSrc:   (P) Oral   SpO2: 96% 96% 99% 94%  Weight:      Height:         General:  Appears short of breath with tachypnea Eyes:  PERRL, EOMI, normal lids, iris ENT:  grossly normal hearing, lips & tongue, mmm Neck:  no LAD, masses or thyromegaly Cardiovascular:  RRR, no m/r/g. 1-2+ LE edema.  Respiratory:  CTA bilaterally, no w/r/r.  Abdomen: soft, ntnd, NABS Skin:  no rash or induration seen on limited exam Musculoskeletal:  grossly normal tone BUE/BLE,  good ROM, no bony abnormality Psychiatric:  grossly normal mood and affect, speech fluent and appropriate, AOx3 Neurologic:  CN 2-12 grossly intact, moves all extremities in coordinated fashion, sensation intact  Labs on Admission: I have personally reviewed following labs and imaging studies  CBC:  Recent Labs Lab 01/10/17 1637  WBC 13.5*  NEUTROABS 9.8*  HGB 12.5  HCT 37.7  MCV 91.5  PLT  260   Basic Metabolic Panel:  Recent Labs Lab 01/10/17 1637  NA 137  K 3.6  CL 105  CO2 22  GLUCOSE 175*  BUN 23*  CREATININE 0.90  CALCIUM 9.4   GFR: Estimated Creatinine Clearance: 44.6 mL/min (by C-G formula based on SCr of 0.9 mg/dL). Liver Function Tests:  Recent Labs Lab 01/10/17 1637  AST 24  ALT 18  ALKPHOS 64  BILITOT 0.4  PROT 7.4  ALBUMIN 3.1*   No results for input(s): LIPASE, AMYLASE in the last 168 hours. No results for input(s): AMMONIA in the last 168 hours. Coagulation Profile: No results for input(s): INR, PROTIME in the last 168 hours. Cardiac Enzymes: No results for input(s): CKTOTAL, CKMB, CKMBINDEX, TROPONINI in the last 168 hours. BNP (last 3 results) No results for input(s): PROBNP in the last 8760 hours. HbA1C: No results for input(s): HGBA1C in the last 72 hours. CBG:  Recent Labs Lab 01/10/17 2221  GLUCAP 151*   Lipid Profile: No results for input(s): CHOL, HDL, LDLCALC, TRIG, CHOLHDL, LDLDIRECT in the last 72 hours. Thyroid Function Tests: No results for input(s): TSH, T4TOTAL, FREET4, T3FREE, THYROIDAB in the last 72 hours. Anemia Panel: No results for input(s): VITAMINB12, FOLATE, FERRITIN, TIBC, IRON, RETICCTPCT in the last 72 hours. Urine analysis: No results found for: COLORURINE, APPEARANCEUR, LABSPEC, PHURINE, GLUCOSEU, HGBUR, BILIRUBINUR, KETONESUR, PROTEINUR, UROBILINOGEN, NITRITE, LEUKOCYTESUR  Creatinine Clearance: Estimated Creatinine Clearance: 44.6 mL/min (by C-G formula based on SCr of 0.9 mg/dL).  Sepsis  Labs: (procalcitonin:4,lacticidven:4) )No results found for this or any previous visit (from the past 240 hour(s)).   Radiological Exams on Admission: Dg Chest 2 View  Result Date: 01/10/2017 CLINICAL DATA:  Shortness of Breath EXAM: CHEST  2 VIEW COMPARISON:  None. FINDINGS: Cardiac shadow is at the upper limits of normal in size. Diffuse fibrotic changes are identified bilaterally likely of a chronic nature and consistent with the patient's given clinical history. No definitive consolidation to suggest acute infiltrate is noted. No bony abnormality is seen. IMPRESSION: Diffuse fibrotic changes which are likely chronic given the patient's clinical history. Electronically Signed   By: Alcide Clever M.D.   On: 01/10/2017 16:46   Ct Angio Chest Pe W And/or Wo Contrast  Result Date: 01/10/2017 CLINICAL DATA:  Shortness of breath for 2 years, pulmonary fibrosis, COPD EXAM: CT ANGIOGRAPHY CHEST WITH CONTRAST TECHNIQUE: Multidetector CT imaging of the chest was performed using the standard protocol during bolus administration of intravenous contrast. Multiplanar CT image reconstructions and MIPs were obtained to evaluate the vascular anatomy. CONTRAST:  100 cc Isovue COMPARISON:  None. FINDINGS: Cardiovascular: Cardiomegaly is noted. No pericardial effusion. No pulmonary embolus is noted. There is no aortic aneurysm. Atherosclerotic calcifications of thoracic aorta and coronary arteries. Small hiatal hernia. Mediastinum/Nodes: A precarinal lymph node Measures 1.1 cm short-axis. AP window lymph node measures 1.2 cm short-axis. Left anterior mediastinal lymph node just lateral to pulmonary artery measures 1 cm short-axis. Right hilar lymph node Measures 1 cm short-axis. Left hilar lymph node Measures 1 cm short-axis. These are borderline enlarged by size criteria probable reactive. Central airways are patent. Lungs/Pleura: There is bilateral chronic interstitial lung disease with significant cystic  changes and peribronchovascular interstitial thickening. Peripheral fibrotic changes with honeycombing are noted bilateral upper lobes and lower lobes. There is thickening of interlobular septa bilateral peripheral. No definite superimposed infiltrate or pulmonary edema. Findings probable due to chronic UIP and fibrotic changes. Upper Abdomen: The visualized upper abdomen shows no adrenal gland mass. Visualized  upper kidneys are unremarkable. Liver and spleen are unremarkable. Musculoskeletal: No destructive bony lesions are noted. Sagittal images of the spine are mild degenerative changes mid and lower thoracic spine Review of the MIP images confirms the above findings. IMPRESSION: 1. No pulmonary embolus is noted. 2. Probable chronic extensive interstitial lung disease bilaterally. Bilateral cystic and fibrotic changes. No definite superimposed acute infiltrate or pulmonary edema. 3. There is bilateral borderline mediastinal and hilar adenopathy probable reactive in nature. No definite pathologic lymph nodes are noted. 4. Cardiomegaly is noted. Atherosclerotic calcifications of thoracic aorta and coronary arteries. Electronically Signed   By: Natasha Mead M.D.   On: 01/10/2017 19:43    EKG: Independently reviewed.  NSR with rate 94; PVCs, LAFB, no evidence of acute ischemia  Assessment/Plan Principal Problem:   Acute on chronic respiratory failure with hypoxia (HCC) Active Problems:   Diabetes mellitus type 2 in nonobese [facility])   Essential hypertension   Pulmonary fibrosis (HCC)   Hypothyroidism   Acute on chronic respiratory failure -Patient with known pulmonary fibrosis, appears to be end-stage and rapidly progressive at this point -Previously tried medications and unable to tolerate side effects -A geneticist is reviewing her family's history since she also had a daughter who died of this and she has a brother currently with it as well -Her daughter brought her to [place] last week due to concern  about her mother living alone in [place] -She has an appointment with Dr. Marchelle Gearing in May but would benefit from inpatient consultation in the AM to see if anything else can be done; Dr. Marchelle Gearing happened to be here tonight and so he will see here now -Has home O2 but her need is now >5L, which is the most her current system will provide -Will observe for pulm consultation and case management consultation -Continue high-flow [place] O2, but patient may require BIPAP if this is insufficient -She is DNR/DNI -She requests a palliative care consult -She denies apparent respiratory infection, fever, etc so will not currently continue to treat with antibiotics (although she did receive Levaquin in the ER and this should provide 24 hours of coverage anyway) -Will continue steroids (Solumedrol 60 mg IV BID) -Will place foley because patient is unable to move about at all; she requests this -Continue Duonebs for comfort, so hold Spiriva  DM -Glucose 175 -Hold oral meds, cover with SSI -Anticipate some worsening of hyperglycemia with initiation of steroids  HTN -Continue Norvasc, Cozaar  Hypothyroidism -Will continue Synthroid but not check TSH due to patient's likely poor prognosis  DVT prophylaxis: Lovenox Code Status:  DNR - confirmed with patient/family Family Communication: Daughter present throughout evaluation  Disposition Plan:  Home once clinically improved Consults called: Pulmonology  Admission status: It is my clinical opinion that referral for OBSERVATION is reasonable and necessary in this patient based on the above information provided. The aforementioned taken together are felt to place the patient at high risk for further clinical deterioration. However it is anticipated that the patient may be medically stable for discharge from the hospital within 24 to 48 hours.    Jonah Blue MD [facility]  If 7PM-7AM, please contact night-coverage [email or web address] Password  [facility]  01/10/2017, 10:58 PM

## 2017-01-10 NOTE — ED Notes (Signed)
Attempted report 

## 2017-01-10 NOTE — ED Notes (Signed)
Bed: ZO10 Expected date:  Expected time:  Means of arrival:  Comments: Ems  Shob

## 2017-01-10 NOTE — ED Notes (Signed)
Patient transported to X-ray 

## 2017-01-10 NOTE — ED Notes (Signed)
ED Provider at bedside. 

## 2017-01-10 NOTE — Assessment & Plan Note (Addendum)
She has failed both Pirfenidone (Esbriet) and Ofev. The diseases and at end-stage. To me the CT looks classic IPF and based on the age and family history this is definite of IPF. For completion sake I will get limited autoimmune profile of ANA, rheumatoid factor and CCP. Regardless the prognosis is less than 6 months and she is hospice eligible  She is very interested in participating in research trials. I will ask pulmonary research coordinator Carron Curie to evaluate her for the [facility] IPF registry noninterventional trial. Patient and her daughter are very interested in this.

## 2017-01-10 NOTE — Assessment & Plan Note (Signed)
I do not think the worsening is due to IPF flare. On the other hand I think this is due to progressive IPF. At this point in time supportive oxygen therapy but we are going to reach limits of a much oxygen we can deliver. I think we should look at symptoms and just except low pulse oximetry goal say greater than 75 or 80% as much as she can tolerate

## 2017-01-10 NOTE — ED Triage Notes (Signed)
Pt arrives via EMS from home (lives with her daughter, [place]) with c/o shortness of breath. Per ems report, pt had tachypnea and hypoxic on EMS arrival to the seen. She uses an oxygen concentrator at home. Hx of idiopathic pulmonary fibrosis. Pt took sertraline x 1 just PTA

## 2017-01-10 NOTE — ED Notes (Signed)
Admitting Provider at bedside. 

## 2017-01-11 ENCOUNTER — Telehealth: Payer: Self-pay | Admitting: Internal Medicine

## 2017-01-11 DIAGNOSIS — Z515 Encounter for palliative care: Secondary | ICD-10-CM | POA: Diagnosis present

## 2017-01-11 DIAGNOSIS — J84112 Idiopathic pulmonary fibrosis: Secondary | ICD-10-CM | POA: Diagnosis present

## 2017-01-11 DIAGNOSIS — Z888 Allergy status to other drugs, medicaments and biological substances status: Secondary | ICD-10-CM | POA: Diagnosis not present

## 2017-01-11 DIAGNOSIS — E1165 Type 2 diabetes mellitus with hyperglycemia: Secondary | ICD-10-CM | POA: Diagnosis present

## 2017-01-11 DIAGNOSIS — J9621 Acute and chronic respiratory failure with hypoxia: Secondary | ICD-10-CM | POA: Diagnosis present

## 2017-01-11 DIAGNOSIS — I110 Hypertensive heart disease with heart failure: Secondary | ICD-10-CM | POA: Diagnosis present

## 2017-01-11 DIAGNOSIS — F419 Anxiety disorder, unspecified: Secondary | ICD-10-CM | POA: Diagnosis present

## 2017-01-11 DIAGNOSIS — E119 Type 2 diabetes mellitus without complications: Secondary | ICD-10-CM

## 2017-01-11 DIAGNOSIS — E039 Hypothyroidism, unspecified: Secondary | ICD-10-CM | POA: Diagnosis present

## 2017-01-11 DIAGNOSIS — R0902 Hypoxemia: Secondary | ICD-10-CM | POA: Diagnosis present

## 2017-01-11 DIAGNOSIS — Z66 Do not resuscitate: Secondary | ICD-10-CM | POA: Diagnosis present

## 2017-01-11 DIAGNOSIS — Z7952 Long term (current) use of systemic steroids: Secondary | ICD-10-CM | POA: Diagnosis not present

## 2017-01-11 DIAGNOSIS — Z9981 Dependence on supplemental oxygen: Secondary | ICD-10-CM | POA: Diagnosis not present

## 2017-01-11 DIAGNOSIS — R0609 Other forms of dyspnea: Secondary | ICD-10-CM | POA: Diagnosis not present

## 2017-01-11 DIAGNOSIS — Z79899 Other long term (current) drug therapy: Secondary | ICD-10-CM | POA: Diagnosis not present

## 2017-01-11 DIAGNOSIS — Z006 Encounter for examination for normal comparison and control in clinical research program: Secondary | ICD-10-CM

## 2017-01-11 DIAGNOSIS — I509 Heart failure, unspecified: Secondary | ICD-10-CM | POA: Diagnosis present

## 2017-01-11 LAB — BASIC METABOLIC PANEL
ANION GAP: 11 (ref 5–15)
BUN: 23 mg/dL — ABNORMAL HIGH (ref 6–20)
CALCIUM: 8.9 mg/dL (ref 8.9–10.3)
CO2: 22 mmol/L (ref 22–32)
Chloride: 102 mmol/L (ref 101–111)
Creatinine, Ser: 1 mg/dL (ref 0.44–1.00)
GFR, EST AFRICAN AMERICAN: 59 mL/min — AB (ref >60)
GFR, EST NON AFRICAN AMERICAN: 51 mL/min — AB (ref >60)
GLUCOSE: 219 mg/dL — AB (ref 65–99)
POTASSIUM: 4.3 mmol/L (ref 3.5–5.1)
Sodium: 135 mmol/L (ref 135–145)

## 2017-01-11 LAB — GLUCOSE, CAPILLARY
GLUCOSE-CAPILLARY: 164 mg/dL — AB (ref 65–99)
Glucose-Capillary: 215 mg/dL — ABNORMAL HIGH (ref 65–99)
Glucose-Capillary: 185 mg/dL — ABNORMAL HIGH (ref 65–99)
Glucose-Capillary: 178 mg/dL — ABNORMAL HIGH (ref 65–99)

## 2017-01-11 LAB — CBC
HEMATOCRIT: 32.7 % — AB (ref 36.0–46.0)
HEMOGLOBIN: 10.6 g/dL — AB (ref 12.0–15.0)
MCH: 29.1 pg (ref 26.0–34.0)
MCHC: 32.4 g/dL (ref 30.0–36.0)
MCV: 89.8 fL (ref 78.0–100.0)
Platelets: 226 10*3/uL (ref 150–400)
RBC: 3.64 MIL/uL — AB (ref 3.87–5.11)
RDW: 14.4 % (ref 11.5–15.5)
WBC: 7.2 10*3/uL (ref 4.0–10.5)

## 2017-01-11 MED ORDER — MORPHINE SULFATE (CONCENTRATE) 10 MG/0.5ML PO SOLN: 5.0000 mg | ORAL | Status: DC | PRN

## 2017-01-11 MED ORDER — MORPHINE SULFATE (CONCENTRATE) 10 MG/0.5ML PO SOLN
5.0000 mg | ORAL | Status: AC
  Administered 2017-01-11: 5 mg via ORAL
  Filled 2017-01-11: qty 0.5

## 2017-01-11 NOTE — Telephone Encounter (Signed)
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   Plan  1. Please call and refer her daughter Janet Cooke - ?last name but the patient to be referred will be Janet Cooke the daughter to [name] of genetics.  [phone].  [name], MS, CGC [facility] [address] [place], [place] [address]  2. You can tell daughter if they go hospice, I can be hospice attending  3. Keep upcoming appt 02/04/17  Thanks  Dr. Kalman Shan, M.D., F.C.C.P Pulmonary and Critical Care Medicine Staff Physician [facility] Pager: [phone], If no answer or between  15:00h - 7:00h: call [phone]  01/11/2017 11:39 AM

## 2017-01-11 NOTE — Progress Notes (Signed)
IPF PRO Registry Purpose: To collect data and biological samples that will support future research studies.  Registry will describe current approaches to diagnosis and treatment of IPF, analyze participant characteristics to describe the natural history of the disease, assess quality of life, describe participants interactions with the health care system, describe IPF treatment practices across multiple institutions, and utilize biological samples linked to well characterized IPF participants to identify disease biomarkers.  --------------------------------------------------------------------------------------------------- Clinical Research Coordinator / Research RN note : This visit for Subject Janet Cooke with DOB: [birth year] on 01/11/2017 for the above protocol is Visit and is for purpose of research. The consent for this encounter is under Protocol Version Amendment 2 [id number] and is currently IRB approved. Subject expressed continued interest and consent in continuing as a study subject. Subject confirmed that there was no  change in contact information (e.g. address, telephone, email). Subject thanked for participation in research and contribution to science.   Refer to subjects paper source binder for the informed consent documentation checklist and patient questionnaires.  Signed by,  Carron Curie, CMA,Clinical Research Coordinator [facility]  [place], [place] 5:34 PM 01/11/2017

## 2017-01-11 NOTE — Consult Note (Signed)
Consultation Note Date: 01/11/2017   Patient Name: Janet Cooke  DOB: 01-06-1932  MRN: [id number]  Age / Sex: 83 y.o., female  PCP: No Pcp Per Patient Referring Physician: Tyrone Nine, MD  Reason for Consultation: Establishing goals of care, Non pain symptom management and Psychosocial/spiritual support  HPI/Patient Profile: 83 y.o. female  admitted on 01/10/2017 with medical history significant of end-stage pulmonary fibrosis, HTN, DM, and CHF presenting to ER with SOB.    Patient with pulmonary fibrosis x 2 years.  She had been doing well with supplemental O2 prn, mostly at night.  In January, she began feeling SOB chronically and was deteriorating, needing more O2.  Increased from 2L to 3L in February, now at 4L. Started on Ofev in Nov 2016 and unable to tolerate it, so violently ill.  Recently moved here from [place] to be closer to daughter 2/2 to continued physical and functional decline.   Dr. Marchelle Gearing is her pulmonologist .  She is currently on  8L via [place]. Pateitn and family face advanced directive decisions and anticipatory care needs  Daughter died of IPF 4 years ago.      Clinical Assessment and Goals of Care:   This NP Lorinda Creed reviewed medical records, received report from team, assessed the patient and then meet at the patient's bedside along with her daughter/ Faye Ramsay to discuss diagnosis, prognosis, GOC, EOL wishes disposition and options.  A detailed discussion was had today regarding advanced directives.  Concepts specific to code status, artifical feeding and hydration, continued IV antibiotics and rehospitalization was had.  The difference between a aggressive medical intervention path  and a palliative comfort care path for this patient at this time was had.  Values and goals of care important to patient and family were attempted to be elicited.              MOST form  introduced  Concept of Hospice and Palliative Care were discussed    Natural trajectory and expectations at EOL were discussed.  Questions and concerns addressed.   Family encouraged to call with questions or concerns.  PMT will continue to support holistically.    SUMMARY OF RECOMMENDATIONS     Patient has made decision to focus on comfort and discharge home with hospice services   Code Status/Advance Care Planning:  DNR   Symptom Management:   Dyspnea: Roxanol 5 mg po/sl every 2 hrs prn  Palliative Prophylaxis:   Bowel Regimen and Oral Care, treat dyspnea  Additional Recommendations (Limitations, Scope, Preferences):  Full Comfort Care  Psycho-social/Spiritual:    Additional Recommendations: Education on Hospice  Prognosis:   < 6 months  Discharge Planning: Discussed with Dr Jarvis Newcomer, will treat symptoms over the next 24 hours and home with hospice in the morning..  Will need transport   Home with Hospice      Primary Diagnoses: Present on Admission: . (Resolved) Acute on chronic respiratory failure with hypoxia (HCC) . Essential hypertension . (Resolved) Pulmonary fibrosis (HCC)   I have  reviewed the medical record, interviewed the patient and family, and examined the patient. The following aspects are pertinent.  Past Medical History:  Diagnosis Date  . CHF (congestive heart failure) (HCC)    uncertain if systolic or diastolic  . Diabetes mellitus without complication (HCC)   . Hypertension   . Hypothyroidism   . Pulmonary fibrosis [facility])    Social History   Social History  . Marital status: Widowed    Spouse name: N/A  . Number of children: N/A  . Years of education: N/A   Occupational History  . retired    Social History Main Topics  . Smoking status: Former Smoker    Packs/day: 1.00    Years: 35.00    Types: Cigarettes  . Smokeless tobacco: Former Neurosurgeon  . Alcohol use Yes     Comment: very little  . Drug use: No  . Sexual activity: Not  Asked   Other Topics Concern  . None   Social History Narrative  . None   Family History  Problem Relation Age of Onset  . Pulmonary fibrosis Brother     on Esbriet  . Pulmonary fibrosis Daughter 59   Scheduled Meds: . allopurinol  100 mg Oral Daily  . amLODipine  5 mg Oral Daily  . docusate sodium  100 mg Oral BID  . enoxaparin (LOVENOX) injection  40 mg Subcutaneous QHS  . insulin aspart  0-15 Units Subcutaneous TID WC  . insulin aspart  0-5 Units Subcutaneous QHS  . ipratropium-albuterol  3 mL Nebulization Q4H  . latanoprost  1 drop Both Eyes QHS  . levothyroxine  75 mcg Oral QAC breakfast  . LORazepam  0.5 mg Oral Q6H  . losartan  100 mg Oral Daily  . methylPREDNISolone (SOLU-MEDROL) injection  60 mg Intravenous Q12H  . mometasone-formoterol  2 puff Inhalation BID  . sertraline  50 mg Oral Daily  . timolol  1 drop Both Eyes BID   Continuous Infusions: PRN Meds:.acetaminophen **OR** acetaminophen, albuterol, LORazepam, ondansetron **OR** ondansetron (ZOFRAN) IV Medications Prior to Admission:  Prior to Admission medications   Medication Sig Start Date End Date Taking? Authorizing Provider  albuterol (VENTOLIN HFA) 108 (90 Base) MCG/ACT inhaler Inhale 2 puffs into the lungs every 4 (four) hours as needed for wheezing.   Yes Historical Provider, MD  allopurinol (ZYLOPRIM) 100 MG tablet Take 100 mg by mouth daily.   Yes Historical Provider, MD  amLODipine (NORVASC) 5 MG tablet Take 5 mg by mouth daily.   Yes Historical Provider, MD  budesonide-formoterol (SYMBICORT) 160-4.5 MCG/ACT inhaler Inhale 2 puffs into the lungs 2 (two) times daily.   Yes Historical Provider, MD  Cholecalciferol (VITAMIN D3) 2000 units TABS Take 2,000 Units by mouth daily.   Yes Historical Provider, MD  furosemide (LASIX) 20 MG tablet Take 20 mg by mouth.   Yes Historical Provider, MD  glimepiride (AMARYL) 4 MG tablet Take 4 mg by mouth daily before breakfast.   Yes Historical Provider, MD    latanoprost (XALATAN) 0.005 % ophthalmic solution Place 1 drop into both eyes at bedtime.   Yes Historical Provider, MD  levothyroxine (SYNTHROID, LEVOTHROID) 75 MCG tablet Take 75 mcg by mouth daily before breakfast.   Yes Historical Provider, MD  linagliptin (TRADJENTA) 5 MG TABS tablet Take 5 mg by mouth daily. With food   Yes Historical Provider, MD  losartan (COZAAR) 100 MG tablet Take 100 mg by mouth daily.   Yes Historical Provider, MD  sertraline (ZOLOFT) 50  MG tablet Take 50 mg by mouth daily.   Yes Historical Provider, MD  timolol (BETIMOL) 0.5 % ophthalmic solution Apply 1 drop to eye 2 (two) times daily.   Yes Historical Provider, MD  tiotropium (SPIRIVA) 18 MCG inhalation capsule Place 18 mcg into inhaler and inhale daily.   Yes Historical Provider, MD  vitamin B-12 (CYANOCOBALAMIN) 500 MCG tablet Take 500 mcg by mouth daily.   Yes Historical Provider, MD   Allergies  Allergen Reactions  . Lipitor [Atorvastatin] Other (See Comments)    Muscle pain  . Lisinopril     Cough   Review of Systems  Constitutional: Positive for activity change and fatigue.  Respiratory: Positive for shortness of breath.   Neurological: Positive for weakness.    Physical Exam  Constitutional: She is oriented to person, place, and time. She appears well-developed. She appears ill. Nasal cannula in place.  Cardiovascular: Normal rate, regular rhythm and normal heart sounds.   Pulmonary/Chest: Tachypnea noted. She has decreased breath sounds in the right lower field and the left lower field.  Neurological: She is alert and oriented to person, place, and time.  Skin: Skin is warm and dry.    Vital Signs: BP 131/86 (BP Location: Left Arm)   Pulse 66   Temp 97.4 F (36.3 C) (Oral)   Resp 20   Ht  (1.6 m)   Wt 68 kg (150 lb)   SpO2 99%   BMI 26.57 kg/m  Pain Assessment: 0-10   Pain Score: Asleep   SpO2: SpO2: 99 % O2 Device:SpO2: 99 % O2 Flow Rate: .O2 Flow Rate (L/min): 6  L/min  IO: Intake/output summary: No intake or output data in the 24 hours ending 01/11/17 0831  LBM: Last BM Date: 01/09/17 ("yesterday" per patient and Melaine, daughter) Baseline Weight: Weight: 68 kg (150 lb) Most recent weight: Weight: 68 kg (150 lb)      Palliative Assessment/Data: 30 %    Discussed with Dr Jarvis Newcomer  Time In: 0915 Time Out: 1030 Time Total: 75 min Greater than 50%  of this time was spent counseling and coordinating care related to the above assessment and plan.  Signed by: Lorinda Creed, NP   Please contact Palliative Medicine Team phone at [phone] for questions and concerns.  For individual provider: See Loretha Stapler

## 2017-01-11 NOTE — Progress Notes (Signed)
[facility] RN visit at 1430  Notified by [facility] of patient and family request for [facility] at home after discharge. Chart and patient information reviewed with Dr. Elliot Gurney, [facility] Medical Director, and hospice eligibility is confirmed.    Spoke with patient and daughter Faye Ramsay at bedside to initiate education related to hospice philosophy, services and team approach to care.  Family verbalized understanding of information and confirmed interest in [facility] services.  Per discussion plan is for discharge to daughter's home by ambulance on 01/12/17.  Please send signed and completed Gold DNR form home with patient. Pt will need prescriptions for discharge comfort medications.  DME needs discussed, Patient currently has a BSC, Wheelchair, walker, outside ramp and O2 in the home. Patient will need a hospital bed with over the bed table and new concentrator/O2 set up for increased O2 needs.  Will notify [facility] equipment manager Loistine Simas of DME needs and will contact [facility] to arrange delivery to the home.  The home address has been verified and is correct on the chart; Daughter [name] will be contacted to arrange time of delivery. Above information and contact numbers have been given to Daughter [name] and patient during visit.  [facility] and [facility] are aware of the above.  Completed d/c summary will need to be faxed to [facility] at [phone] when final. Please notify [facility] when pt is ready to leave unit at d/c - call [phone] ( or [phone] after 5pm).  Please call with any questions. Thank you.  Roda Shutters, RN [facility] Liaison [phone]

## 2017-01-11 NOTE — Progress Notes (Signed)
PROGRESS NOTE  Janet Cooke  FUX:[id number] DOB: 1931-11-13 DOA: 01/09/2017 PCP: No PCP Per Patient; needs to establish care in area   Brief Narrative: Janet Cooke is a 83 y.o. female with medical history significant of end-stage pulmonary fibrosis, HTN, DM, and CHF (no further information about systolic/diastolic) presenting with SOB. She was diagnosed 2 years ago and has noticed acceleration of progression of disease over the past few months requiring increasing amounts of supplemental oxygen, first at night, now continuously. She was also admitted for this in February and after 30 days of SNF was able to ambulate 10-20 feet. She recently moved from [place] to be cared for by her daughter but breathing became gradually too labored and she brought her to the ED 2023/01/24. Her daughter died of pulmonary fibrosis 4 years ago and her brother is currently on medication for it. She was unable to tolerate multiple medications. she was scheduled with pulm in May, and was seen by Dr. Chase Caller on the night of admission, 2023-01-24. Prednisone, levaquin, xanax, and duonebs were provided with some improvement. Palliative care has met with the patient and the plan is to discharge if still stable in 24 hours with home hospice and DME.   Assessment & Plan: Principal Problem:   Acute on chronic respiratory failure with hypoxemia (HCC) Active Problems:   Diabetes mellitus type 2 in nonobese [facility])   Essential hypertension   Hypothyroidism   IPF (idiopathic pulmonary fibrosis) ([place])   Dyspnea   Family history of pulmonary fibrosis   Palliative care by specialist   DNR (do not resuscitate)  Acute on chronic hypoxemic respiratory failure due to progressive idiopathic pulmonary fibrosis: Not likely due to flare, infection, volume overload, etc.  - Appreciate pulmonology recommendations. Has not tolerated medications (pirfenidone and ofev).  - Continue supplemental oxygen with permissive hypoxia, goal > 75-80%.  -  Continue roxanol for air hunger as this helped earlier. Plan to Rx this on discharge.  - Appreciate palliative care assistance: Plan to discharge home with hospice given end-stage disease, prognosis is well within 6 months. Pt DNR/DNI. - Genetics counselor consulted by pulm to see if he can help daughter Threasa Beards with IPF genetics. Meanwhile daughter will try to obtain information about the genetic test that was done in [place] for the patient - Pulmonology is working on enrolling with pulmonary research coordinator [place] [employer] to evaluate her for the BI IPF registry noninterventional tria - Hold abx, continue steroids - Foley placed due to limited ability to ambulate, per pt request.  - Continue Duonebs for comfort, so holding spiriva  DM: Anticipate some worsening of hyperglycemia with initiation of steroids - Hold oral meds, cover with SSI. Will allow permissive hyperglycemia unless putting at significant risk for dehydration due to osmotic diuresis  HTN - Continue norvasc, cozaar  Hypothyroidism - Continue synthroid.  DVT prophylaxis: Lovenox Code Status: DNR Family Communication: Daughter at bedside Disposition Plan: Anticipate home with daughter if remains stable 4/17 with home hospice.  Consultants:   Palliative care  Pulmonology  Procedures:   Foley 01-24-2023  Antimicrobials:  Levaquin x1   Subjective: Pt reports improved dyspnea. No significant cough, Breathing treatments helping. Morphine helped.   Objective: Vitals:   23-Jan-2017 2247 01/11/17 0540 01/11/17 0854 01/11/17 0855  BP:  131/86    Pulse:  66    Resp:  20    Temp:  97.4 F (36.3 C)    TempSrc:  Oral    SpO2: 94% 99% 98% 98%  Weight:      Height:        Intake/Output Summary (Last 24 hours) at 01/11/17 1457 Last data filed at 01/11/17 1000  Gross per 24 hour  Intake              360 ml  Output                0 ml  Net              360 ml   Filed Weights   01/10/17 1618  Weight: 68 kg  (150 lb)    Examination: General exam: Pleasant elderly female in no distress  Respiratory system: Labored tachypnea on 6L by [place], 88-94% on pulse ox at rest. Runs out of breath with extended sentences. Bibasilar Cardiovascular system: Regular rate and rhythm. No murmur, rub, or gallop. No JVD, and no pedal edema. Gastrointestinal system: Abdomen soft, non-tender, non-distended, with normoactive bowel sounds. No organomegaly or masses felt. Central nervous system: Alert and oriented. No focal neurological deficits. Extremities: Warm, no deformities Skin: No rashes, lesions no ulcers Psychiatry: Judgement and insight appear normal. Mood & affect appropriate.   Data Reviewed: I have personally reviewed following labs and imaging studies  CBC:  Recent Labs Lab 01/10/17 1637 01/11/17 0412  WBC 13.5* 7.2  NEUTROABS 9.8*  --   HGB 12.5 10.6*  HCT 37.7 32.7*  MCV 91.5 89.8  PLT 260 101   Basic Metabolic Panel:  Recent Labs Lab 01/10/17 1637 01/11/17 0412  NA 137 135  K 3.6 4.3  CL 105 102  CO2 22 22  GLUCOSE 175* 219*  BUN 23* 23*  CREATININE 0.90 1.00  CALCIUM 9.4 8.9   GFR: Estimated Creatinine Clearance: 40.1 mL/min (by C-G formula based on SCr of 1 mg/dL). Liver Function Tests:  Recent Labs Lab 01/10/17 1637  AST 24  ALT 18  ALKPHOS 64  BILITOT 0.4  PROT 7.4  ALBUMIN 3.1*   No results for input(s): LIPASE, AMYLASE in the last 168 hours. No results for input(s): AMMONIA in the last 168 hours. Coagulation Profile: No results for input(s): INR, PROTIME in the last 168 hours. Cardiac Enzymes: No results for input(s): CKTOTAL, CKMB, CKMBINDEX, TROPONINI in the last 168 hours. BNP (last 3 results) No results for input(s): PROBNP in the last 8760 hours. HbA1C: No results for input(s): HGBA1C in the last 72 hours. CBG:  Recent Labs Lab 01/10/17 2221 01/11/17 0800 01/11/17 1302  GLUCAP 151* 215* 185*   Lipid Profile: No results for input(s): CHOL, HDL,  LDLCALC, TRIG, CHOLHDL, LDLDIRECT in the last 72 hours. Thyroid Function Tests: No results for input(s): TSH, T4TOTAL, FREET4, T3FREE, THYROIDAB in the last 72 hours. Anemia Panel: No results for input(s): VITAMINB12, FOLATE, FERRITIN, TIBC, IRON, RETICCTPCT in the last 72 hours. Urine analysis: No results found for: COLORURINE, APPEARANCEUR, LABSPEC, PHURINE, GLUCOSEU, HGBUR, BILIRUBINUR, KETONESUR, PROTEINUR, UROBILINOGEN, NITRITE, LEUKOCYTESUR No results found for this or any previous visit (from the past 240 hour(s)).    Radiology Studies: Dg Chest 2 View  Result Date: 01/10/2017 CLINICAL DATA:  Shortness of Breath EXAM: CHEST  2 VIEW COMPARISON:  None. FINDINGS: Cardiac shadow is at the upper limits of normal in size. Diffuse fibrotic changes are identified bilaterally likely of a chronic nature and consistent with the patient's given clinical history. No definitive consolidation to suggest acute infiltrate is noted. No bony abnormality is seen. IMPRESSION: Diffuse fibrotic changes which are likely chronic given the patient's clinical history. Electronically  Signed   By: Inez Catalina M.D.   On: 01/10/2017 16:46   Ct Angio Chest Pe W And/or Wo Contrast  Result Date: 01/10/2017 CLINICAL DATA:  Shortness of breath for 2 years, pulmonary fibrosis, COPD EXAM: CT ANGIOGRAPHY CHEST WITH CONTRAST TECHNIQUE: Multidetector CT imaging of the chest was performed using the standard protocol during bolus administration of intravenous contrast. Multiplanar CT image reconstructions and MIPs were obtained to evaluate the vascular anatomy. CONTRAST:  100 cc Isovue COMPARISON:  None. FINDINGS: Cardiovascular: Cardiomegaly is noted. No pericardial effusion. No pulmonary embolus is noted. There is no aortic aneurysm. Atherosclerotic calcifications of thoracic aorta and coronary arteries. Small hiatal hernia. Mediastinum/Nodes: A precarinal lymph node Measures 1.1 cm short-axis. AP window lymph node measures 1.2 cm  short-axis. Left anterior mediastinal lymph node just lateral to pulmonary artery measures 1 cm short-axis. Right hilar lymph node Measures 1 cm short-axis. Left hilar lymph node Measures 1 cm short-axis. These are borderline enlarged by size criteria probable reactive. Central airways are patent. Lungs/Pleura: There is bilateral chronic interstitial lung disease with significant cystic changes and peribronchovascular interstitial thickening. Peripheral fibrotic changes with honeycombing are noted bilateral upper lobes and lower lobes. There is thickening of interlobular septa bilateral peripheral. No definite superimposed infiltrate or pulmonary edema. Findings probable due to chronic UIP and fibrotic changes. Upper Abdomen: The visualized upper abdomen shows no adrenal gland mass. Visualized upper kidneys are unremarkable. Liver and spleen are unremarkable. Musculoskeletal: No destructive bony lesions are noted. Sagittal images of the spine are mild degenerative changes mid and lower thoracic spine Review of the MIP images confirms the above findings. IMPRESSION: 1. No pulmonary embolus is noted. 2. Probable chronic extensive interstitial lung disease bilaterally. Bilateral cystic and fibrotic changes. No definite superimposed acute infiltrate or pulmonary edema. 3. There is bilateral borderline mediastinal and hilar adenopathy probable reactive in nature. No definite pathologic lymph nodes are noted. 4. Cardiomegaly is noted. Atherosclerotic calcifications of thoracic aorta and coronary arteries. Electronically Signed   By: Lahoma Crocker M.D.   On: 01/10/2017 19:43    Scheduled Meds: . allopurinol  100 mg Oral Daily  . amLODipine  5 mg Oral Daily  . docusate sodium  100 mg Oral BID  . enoxaparin (LOVENOX) injection  40 mg Subcutaneous QHS  . insulin aspart  0-15 Units Subcutaneous TID WC  . insulin aspart  0-5 Units Subcutaneous QHS  . ipratropium-albuterol  3 mL Nebulization Q4H  . latanoprost  1 drop  Both Eyes QHS  . levothyroxine  75 mcg Oral QAC breakfast  . LORazepam  0.5 mg Oral Q6H  . losartan  100 mg Oral Daily  . methylPREDNISolone (SOLU-MEDROL) injection  60 mg Intravenous Q12H  . mometasone-formoterol  2 puff Inhalation BID  . sertraline  50 mg Oral Daily  . timolol  1 drop Both Eyes BID   Continuous Infusions:   LOS: 0 days   Time spent: 25 minutes.  Vance Gather, MD [facility] Pager [phone]  If 7PM-7AM, please contact night-coverage [email or web address] Password [facility] 01/11/2017, 2:57 PM

## 2017-01-11 NOTE — Care Management Note (Signed)
Case Management Note  Patient Details  Name: Janet Cooke MRN: [id number] Date of Birth: 03-17-1932  Subjective/Objective:    83 yo admitted with Acute on Chronic respiratory failure. Pt with hx of Pulmonary Fibrosis.                Action/Plan: Pt has recently moved here from [place] to live with her daughter as she is no longer able to take care of herself. Pt states she has a travel chair and home 02 currently and is requesting a hospital bed. CM consult for Home with Hospice. Choice was offered to pt and [facility] chosen. [facility] referral line called for referral. CM will continue to follow.  Expected Discharge Date:  01/13/17               Expected Discharge Plan:  Home w Hospice Care  In-House Referral:     Discharge planning Services  CM Consult  Post Acute Care Choice:  Hospice Choice offered to:  Patient  DME Arranged:    DME Agency:     HH Arranged:  Disease Management HH Agency:  [facility]  Status of Service:  In process, will continue to follow  If discussed at Long Length of Stay Meetings, dates discussed:    Additional CommentsBartholome Bill, RN 01/11/2017, 11:52 AM  [phone]

## 2017-01-12 LAB — GLUCOSE, CAPILLARY
GLUCOSE-CAPILLARY: 209 mg/dL — AB (ref 65–99)
Glucose-Capillary: 179 mg/dL — ABNORMAL HIGH (ref 65–99)

## 2017-01-12 LAB — CYCLIC CITRUL PEPTIDE ANTIBODY, IGG/IGA: CCP Antibodies IgG/IgA: 171 units — ABNORMAL HIGH (ref 0–19)

## 2017-01-12 LAB — RHEUMATOID FACTOR: Rhuematoid fact SerPl-aCnc: 10.2 IU/mL (ref 0.0–13.9)

## 2017-01-12 LAB — ANTINUCLEAR ANTIBODIES, IFA: ANTINUCLEAR ANTIBODIES, IFA: NEGATIVE

## 2017-01-12 MED ORDER — LORAZEPAM 0.5 MG PO TABS: 0.5000 mg | ORAL_TABLET | Freq: Four times a day (QID) | ORAL | 0 refills | Status: AC | PRN

## 2017-01-12 MED ORDER — LORAZEPAM 0.5 MG PO TABS: 0.5000 mg | ORAL_TABLET | Freq: Four times a day (QID) | ORAL | 0 refills | Status: DC | PRN

## 2017-01-12 MED ORDER — IPRATROPIUM-ALBUTEROL 0.5-2.5 (3) MG/3ML IN SOLN: 3.0000 mL | RESPIRATORY_TRACT | 0 refills | Status: DC | PRN

## 2017-01-12 MED ORDER — MORPHINE SULFATE (CONCENTRATE) 10 MG/0.5ML PO SOLN: 5.0000 mg | ORAL | 0 refills | Status: AC | PRN

## 2017-01-12 MED ORDER — ALBUTEROL SULFATE (2.5 MG/3ML) 0.083% IN NEBU: 2.5000 mg | INHALATION_SOLUTION | RESPIRATORY_TRACT | 0 refills | Status: AC | PRN

## 2017-01-12 MED ORDER — IPRATROPIUM-ALBUTEROL 0.5-2.5 (3) MG/3ML IN SOLN
3.0000 mL | RESPIRATORY_TRACT | Status: DC
  Administered 2017-01-12: 3 mL via RESPIRATORY_TRACT
  Filled 2017-01-12 (×2): qty 3

## 2017-01-12 NOTE — Progress Notes (Signed)
Pt discharged to home via [facility]. Family not present but aware & awaiting arrival. [facility] following Pt.

## 2017-01-12 NOTE — Discharge Summary (Signed)
Physician Discharge Summary  Janet Cooke MWN:[id number] DOB: [birth year] DOA: 2017/02/03  PCP: No PCP Per Patient  Admit date: 2017-02-03 Discharge date: 01/12/2017  Admitted From: Home Disposition: Home   Recommendations for Outpatient Follow-up:  1. Follow up with home hospice, symptom control.  2. Follow up with Dr. Chase Caller May 10. Planning to enroll in nonintervention trial. 3. Follow up on genetics counseling.   Home Health: Hospice Equipment/Devices: 6-8 L O2, hospital bed, in addition to previous DME. Discharge Condition: Stable for transfer, guarded prognosis.  CODE STATUS: DNR Diet recommendation: Heart healthy, carb-modified  Brief/Interim Summary: Janet Cooke a 83 y.o.femalewith medical history significant of end-stage pulmonary fibrosis, HTN, DM, and CHF (no further information about systolic/diastolic) presenting with SOB. She was diagnosed 2 years ago and has noticed acceleration of progression of disease over the past few months requiring increasing amounts of supplemental oxygen, first at night, now continuously. She was also admitted for this in February and after 30 days of SNF was able to ambulate 10-20 feet. She recently moved from [place] to be cared for by her daughter but breathing became gradually too labored and she brought her to the ED Feb 04, 2023. Her daughter died of pulmonary fibrosis 4 years ago and her brother is currently on medication for it. She was unable to tolerate multiple medications. she was scheduled with pulm in May, and was seen by Dr. Chase Caller on the night of admission, 2023/02/04. Prednisone, levaquin, xanax, and duonebs were provided with some improvement. Palliative care has met with the patient and the plan is to discharge with home hospice.  Discharge Diagnoses:  Principal Problem:   Acute on chronic respiratory failure with hypoxemia (HCC) Active Problems:   Diabetes mellitus type 2 in nonobese [facility])   Essential hypertension    Hypothyroidism   IPF (idiopathic pulmonary fibrosis) (HCC)   Dyspnea   Family history of pulmonary fibrosis   Palliative care by specialist   DNR (do not resuscitate)   Acute on chronic respiratory failure with hypoxia (HCC)  Acute on chronic hypoxemic respiratory failure due to progressive idiopathic pulmonary fibrosis: Not likely due to flare, infection, volume overload, etc.  - Appreciate pulmonology recommendations. Has not tolerated medications (pirfenidone and ofev).  - Continue supplemental oxygen with permissive hypoxia, goal > 75-80%. Needing 6 - 8 LPM. Oxygen concentrator ordered. - Continue roxanol for air hunger as this helped earlier. - Appreciate palliative care assistance: Plan to discharge home with hospice given end-stage disease, prognosis is well within 6 months. Pt DNR/DNI - form signed.  - Genetics counselor consulted by pulm to see if he can help daughter Threasa Beards with IPF genetics. Meanwhile daughter will try to obtain information about the genetic test that was done in [place] for the patient - Pulmonology is working on enrolling with pulmonary research coordinator [facility] to evaluate her for the BIIPF registry noninterventional trial. Patient has been contacted/consented.  - Plan to follow up with Dr. Chase Caller as previously scheduled on May 10.  - Hold abx and steroids - Continue albuterol, spiriva  Anxiety: Stable, controlled on medications.  - Continue ativan (Rx at discharge)  DM: Anticipate some worsening of hyperglycemia with initiation of steroids which have been stopped. - Restart oral medications  HTN - Continue norvasc, cozaar  Hypothyroidism - Continue synthroid.  Discharge Instructions Discharge Instructions    Discharge instructions    Complete by:  As directed    You were admitted with shortness of breath due to progressive idiopathic pulmonary fibrosis (IPF).  The plan of care is to concentrate on comfort measures with home hospice  following you closely. You are stable for discharge with the following recommendations:  - Continue using oxygen as needed for dyspnea. This will likely be 6-8 liters per minute.  - Medical equipment is being arranged for you prior to discharge. - Start taking nebulized breathing treatments as needed. - For pain or shortness of breath, take morphine concentrate (roxanol) as directed.  - For anxiety, take ativan as directed.  - You should keep your appointment with Dr. Chase Caller on May 10.  - Research coordinator and genetic counselors will be in touch.  - If your symptoms become uncontrolled, seek medical advice.   It was a pleasure getting to know you and your family,  - Vance Gather, MD     Allergies as of 01/12/2017      Reactions   Lipitor [atorvastatin] Other (See Comments)   Muscle pain   Lisinopril    Cough      Medication List    TAKE these medications   allopurinol 100 MG tablet Commonly known as:  ZYLOPRIM Take 100 mg by mouth daily.   amLODipine 5 MG tablet Commonly known as:  NORVASC Take 5 mg by mouth daily.   budesonide-formoterol 160-4.5 MCG/ACT inhaler Commonly known as:  SYMBICORT Inhale 2 puffs into the lungs 2 (two) times daily.   furosemide 20 MG tablet Commonly known as:  LASIX Take 20 mg by mouth.   glimepiride 4 MG tablet Commonly known as:  AMARYL Take 4 mg by mouth daily before breakfast.   ipratropium-albuterol 0.5-2.5 (3) MG/3ML Soln Commonly known as:  DUONEB Take 3 mLs by nebulization every 4 (four) hours as needed.   latanoprost 0.005 % ophthalmic solution Commonly known as:  XALATAN Place 1 drop into both eyes at bedtime.   levothyroxine 75 MCG tablet Commonly known as:  SYNTHROID, LEVOTHROID Take 75 mcg by mouth daily before breakfast.   LORazepam 0.5 MG tablet Commonly known as:  ATIVAN Take 1 tablet (0.5 mg total) by mouth every 6 (six) hours as needed for anxiety.   losartan 100 MG tablet Commonly known as:  COZAAR Take  100 mg by mouth daily.   morphine CONCENTRATE 10 MG/0.5ML Soln concentrated solution Take 0.25 mLs (5 mg total) by mouth every 2 (two) hours as needed for severe pain or shortness of breath.   sertraline 50 MG tablet Commonly known as:  ZOLOFT Take 50 mg by mouth daily.   timolol 0.5 % ophthalmic solution Commonly known as:  BETIMOL Apply 1 drop to eye 2 (two) times daily.   tiotropium 18 MCG inhalation capsule Commonly known as:  SPIRIVA Place 18 mcg into inhaler and inhale daily.   TRADJENTA 5 MG Tabs tablet Generic drug:  linagliptin Take 5 mg by mouth daily. With food   VENTOLIN HFA 108 (90 Base) MCG/ACT inhaler Generic drug:  albuterol Inhale 2 puffs into the lungs every 4 (four) hours as needed for wheezing.   vitamin B-12 500 MCG tablet Commonly known as:  CYANOCOBALAMIN Take 500 mcg by mouth daily.   Vitamin D3 2000 units Tabs Take 2,000 Units by mouth daily.            Durable Medical Equipment        Start     Ordered   01/12/17 [phone]  For home use only DME oxygen  Once    Question Answer Comment  Mode or (Route) Nasal cannula   Liters per  Minute 6   Frequency Continuous (stationary and portable oxygen unit needed)   Oxygen delivery system Gas      01/12/17 0946      Allergies  Allergen Reactions  . Lipitor [Atorvastatin] Other (See Comments)    Muscle pain  . Lisinopril     Cough   Consultations:  Pulmonology, Dr. Chase Caller  Palliative care, Dr. Domingo Cocking  Procedures/Studies: Dg Chest 2 View 01/10/2017 Diffuse fibrotic changes which are likely chronic given the patient's clinical history.  Ct Angio Chest Pe W And/or Wo Contrast 01/10/2017 1. No pulmonary embolus is noted. 2. Probable chronic extensive interstitial lung disease bilaterally. Bilateral cystic and fibrotic changes. No definite superimposed acute infiltrate or pulmonary edema. 3. There is bilateral borderline mediastinal and hilar adenopathy probable reactive in nature. No  definite pathologic lymph nodes are noted. 4. Cardiomegaly is noted. Atherosclerotic calcifications of thoracic aorta and coronary arteries.   Subjective: Pt with improved dyspnea, still very dyspneic with any exertion/long sentences. No chest pain.   Discharge Exam: BP 124/70 (BP Location: Right Arm)   Pulse (!) 54   Temp 98.6 F (37 C) (Oral)   Resp 16   Ht _0  (1.6 m)   Wt 68 kg (150 lb)   SpO2 94%   BMI 26.57 kg/m   General: Pleasant elderly female in no distress Cardiovascular: RRR, no murmur or JVD. Respiratory: Labored tachypnea on 6L by [place], 88-94% on pulse ox at rest. Runs out of breath with extended sentences. Bibasilar velco-like crackles. Abdominal: Soft, NT, ND, bowel sounds + Extremities: No edema  Labs: Basic Metabolic Panel:  Recent Labs Lab 01/10/17 1637 01/11/17 0412  NA 137 135  K 3.6 4.3  CL 105 102  CO2 22 22  GLUCOSE 175* 219*  BUN 23* 23*  CREATININE 0.90 1.00  CALCIUM 9.4 8.9   Liver Function Tests:  Recent Labs Lab 01/10/17 1637  AST 24  ALT 18  ALKPHOS 64  BILITOT 0.4  PROT 7.4  ALBUMIN 3.1*   CBC:  Recent Labs Lab 01/10/17 1637 01/11/17 0412  WBC 13.5* 7.2  NEUTROABS 9.8*  --   HGB 12.5 10.6*  HCT 37.7 32.7*  MCV 91.5 89.8  PLT 260 226   CBG:  Recent Labs Lab 01/11/17 0800 01/11/17 1302 01/11/17 1734 01/11/17 2010 01/12/17 0712  GLUCAP 215* 185* 164* 178* 209*   Time coordinating discharge: Approximately 40 minutes  Vance Gather, MD  [facility] 01/12/2017, 10:06 AM Pager [phone]

## 2017-01-12 NOTE — Telephone Encounter (Addendum)
Called and spoke to pt's daughter, Janet Cooke DOB 05/27/1960. Informed her of the referral. Order placed in pt's chart Janet Cooke). She verbalized understanding and states the pt is with hospice and they will communicate with Hospice about MR being the attending. Nothing further needed at this time.

## 2017-01-12 NOTE — Progress Notes (Signed)
Patient ID: Janet Cooke, female   DOB: July 14, 1932, 83 y.o.   MRN: [id number]  This NP visited patient at the bedside as a follow up to  yesterday's GOCs meeting.  Patient is comfortable with her decision to focus on comfort at this known limited prognosis.  She speaks to her "peace about things".   Was present with patient as she shared her life review.  Emotional support offered  Plan is to discharge home today with hospice services  Educated on the use of Roxanol for dyspnea, "yes that really helps"   Discussed with patient the importance of continued conversation with family and their  medical providers regarding overall plan of care and treatment options,  ensuring decisions are within the context of the patients values and GOCs.  Questions and concerns addressed  Time in   0730        Time out    0805  Greater than 50% of the time was spent in counseling and coordination of care  Lorinda Creed NP  Palliative Medicine Team Team Phone # [phone] Pager [phone]

## 2017-01-12 NOTE — Progress Notes (Signed)
This CM was contacted by pt daughter [name] to inform me that all DME had been delivered to the house and she was ready for her mom to be sent home via ambulance. Gold DNR signed and on front of chart. This CM filled out and printed Medical Necessity form along with demographic form and called [facility] for transport. No other CM needs communicated. Sandford Craze RN,BSN,NCM [phone]

## 2017-01-22 ENCOUNTER — Telehealth: Payer: Self-pay | Admitting: Internal Medicine

## 2017-01-22 NOTE — Telephone Encounter (Signed)
Patient daughter called back - she can be reached at [phone] -pr

## 2017-01-22 NOTE — Telephone Encounter (Signed)
Per MR -  If possible pt will need to come by office to have labs drawn for genetic testing. Pt's daughter, [name] will help facilitate.   LMTCB for Melanie.

## 2017-01-22 NOTE — Telephone Encounter (Signed)
Yeah see if [facility] can draw blood. They might fuss because is new to them but tell thjem there is important genetic stuff we are trying to sort out fast. Any questions the doc can call me. Because IPF is terminal hospoce dx, hospice might fuss because of the bill but you can say the $ amount that was there in the email from [name]  Thanks Dr. Kalman Shan, M.D., [facility].C.P Pulmonary and Critical Care Medicine Staff Physician [facility] Pager: [phone], If no answer or between  15:00h - 7:00h: call [phone]  01/22/2017 11:24 AM

## 2017-01-22 NOTE — Telephone Encounter (Signed)
Called and spoke to pt's daughter, [name]. She states the pt is in her final days of life, she has been declining rapidly. She is at home with hospice care [facility] [employer]). [name] states she is willing to have Hospice collect blood work if this is possible.   MR please advise further. Thanks.

## 2017-01-22 NOTE — Telephone Encounter (Signed)
Routing to Janet Cooke per her request for follow up.

## 2017-01-26 NOTE — Telephone Encounter (Signed)
Issues resolved. MR aware. Blood will be drawn on pt and labs can be collected on pt post-mortem. Will sign off.

## 2017-01-26 DEATH — deceased

## 2017-02-04 ENCOUNTER — Institutional Professional Consult (permissible substitution): Payer: Self-pay | Admitting: Internal Medicine

## 2017-03-04 ENCOUNTER — Encounter: Payer: Self-pay | Admitting: Genetic Counselor

## 2017-03-04 DIAGNOSIS — Z1379 Encounter for other screening for genetic and chromosomal anomalies: Secondary | ICD-10-CM | POA: Insufficient documentation

## 2018-03-23 IMAGING — CR DG CHEST 2V
2 series · 2 of 2 positions shown · non-contrast
Comparison: None.

CLINICAL DATA: Shortness of Breath

EXAM:
CHEST  2 VIEW

[w chest lat]
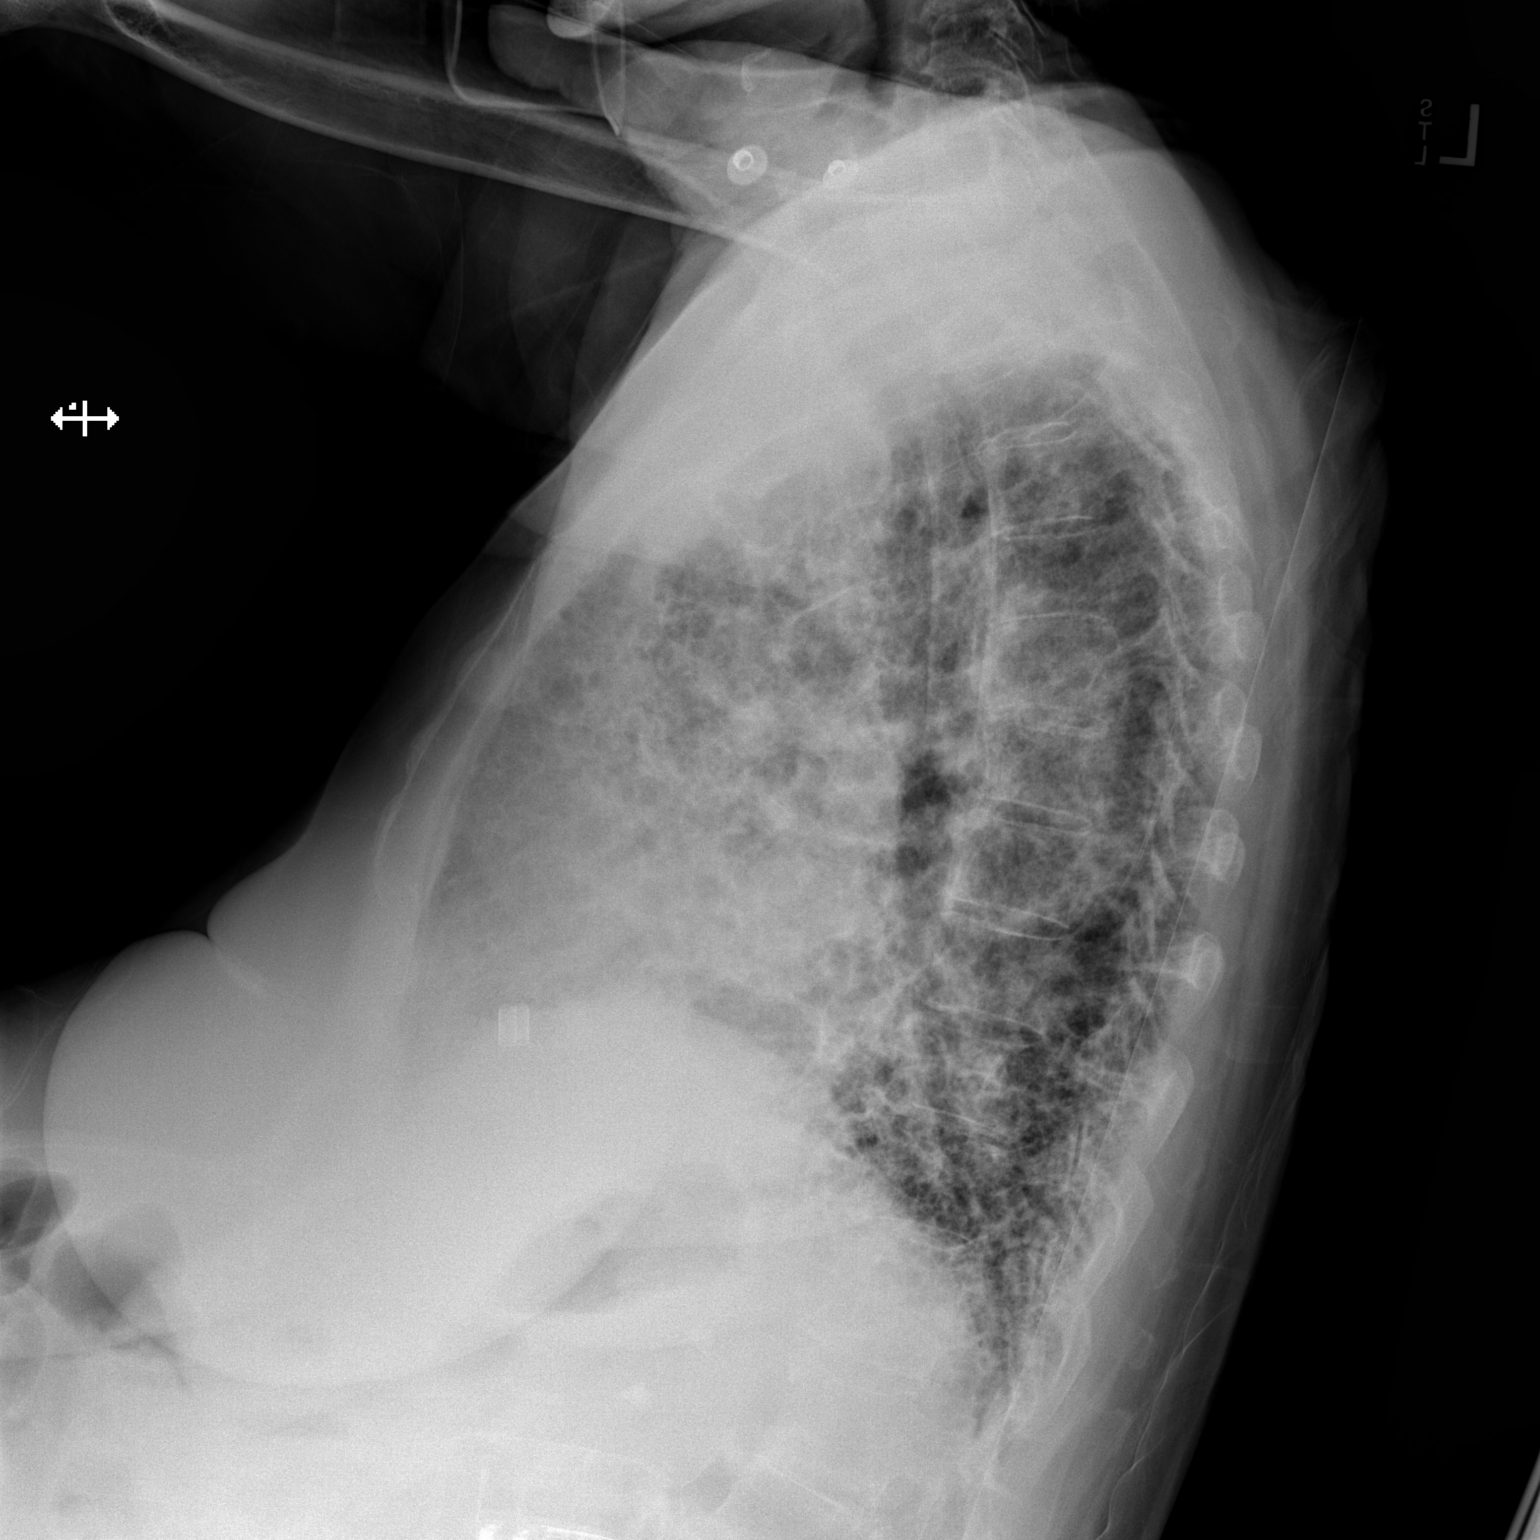

[x chest ap]
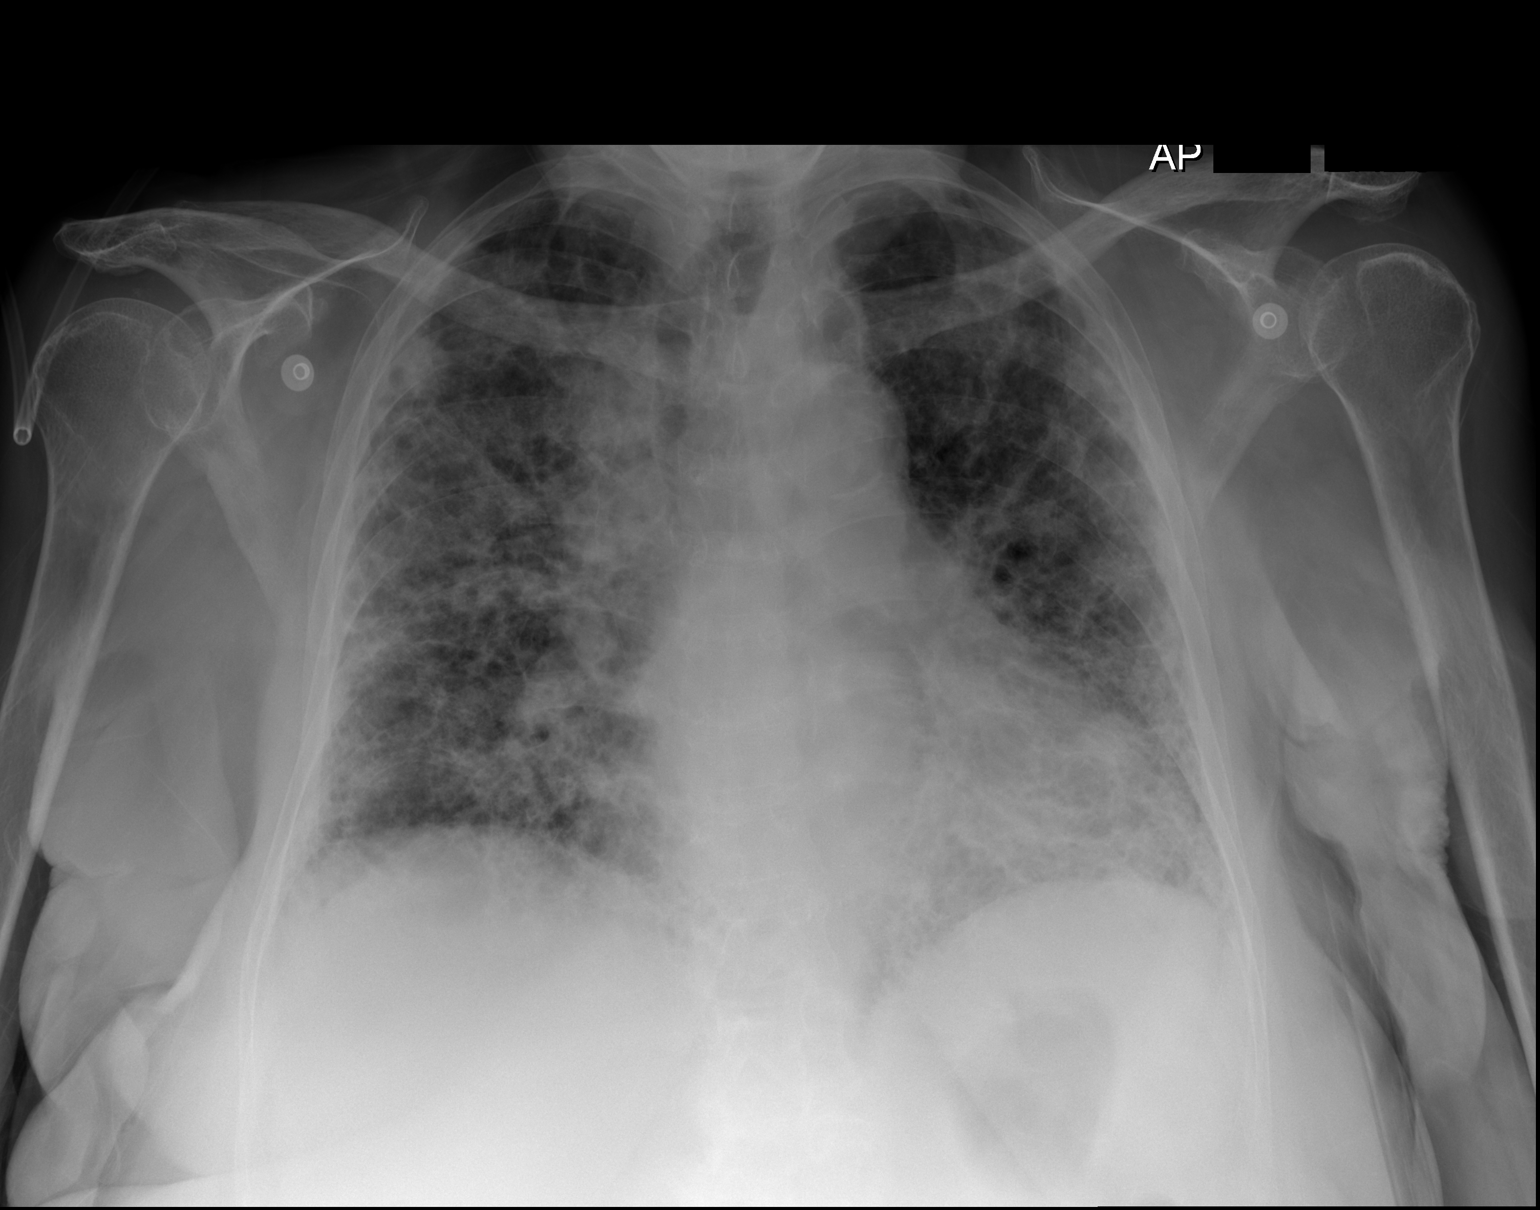

[2 of 2 positions shown; findings below may reference images not displayed]

FINDINGS: Cardiac shadow is at the upper limits of normal in size. Diffuse
fibrotic changes are identified bilaterally likely of a chronic
nature and consistent with the patient's given clinical history. No
definitive consolidation to suggest acute infiltrate is noted. No
bony abnormality is seen.
IMPRESSION: Diffuse fibrotic changes which are likely chronic given the
patient's clinical history.

## 2018-03-23 IMAGING — CT CT ANGIO CHEST
2 of 6 series · 18 of 36 positions shown · IV contrast (ISOVUE 370)
Comparison: None.

CLINICAL DATA: Shortness of breath for 2 years, pulmonary fibrosis,
COPD

EXAM:
CT ANGIOGRAPHY CHEST WITH CONTRAST
TECHNIQUE: Multidetector CT imaging of the chest was performed using the
standard protocol during bolus administration of intravenous
contrast. Multiplanar CT image reconstructions and MIPs were
obtained to evaluate the vascular anatomy.
CONTRAST:  100 cc Isovue

[Series 6: thins for pacs · axial · 0.62mm/px · z∈[-275,-42]mm · 17 of 259 slices shown]
[im 13/259  lung]
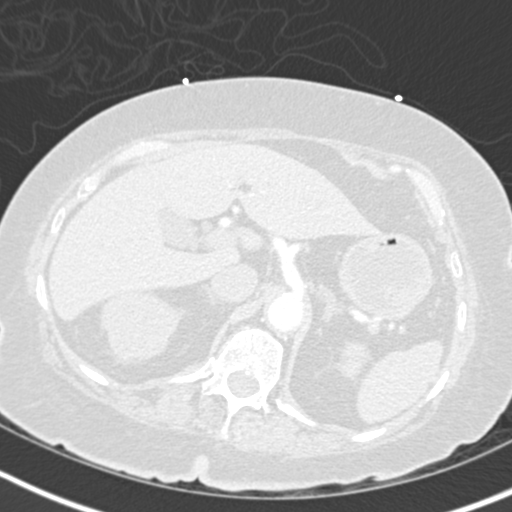
[im 26/259  mediastinal]
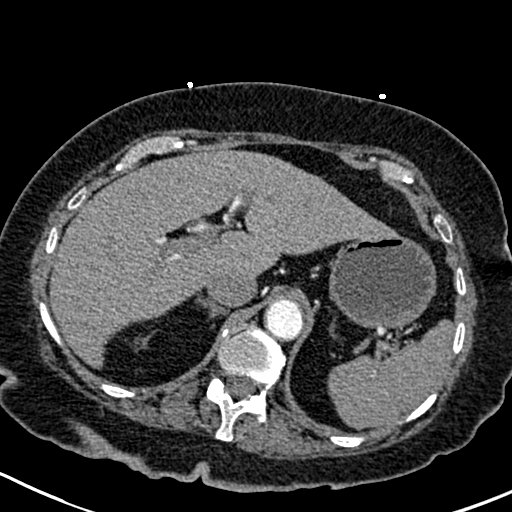
[im 39/259  lung]
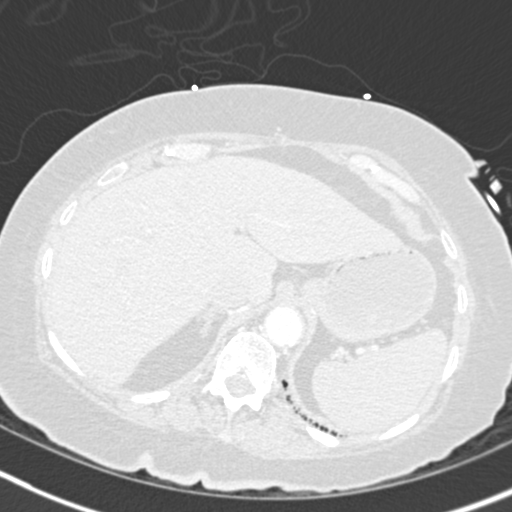
[im 52/259  mediastinal]
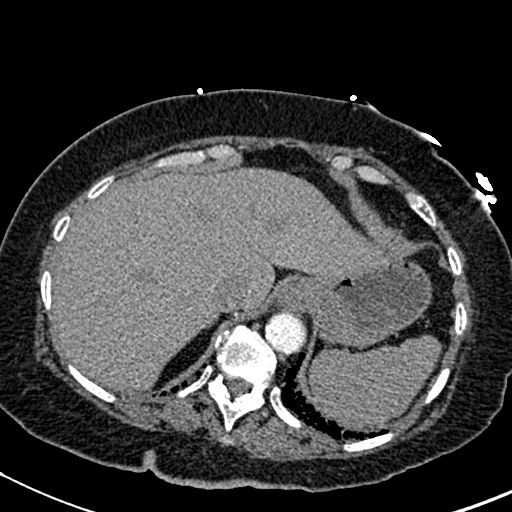
[im 78/259  lung]
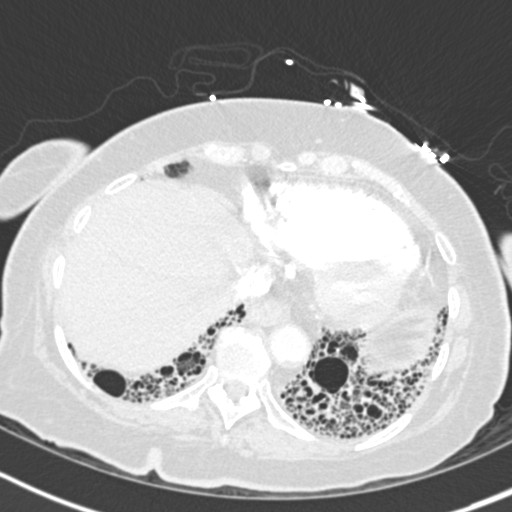
[im 91/259  mediastinal]
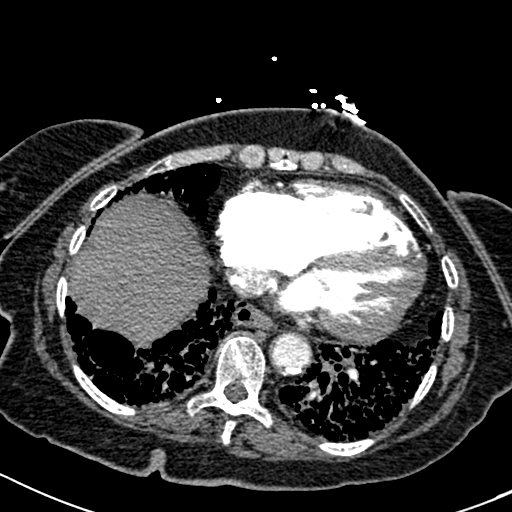
[im 104/259  lung]
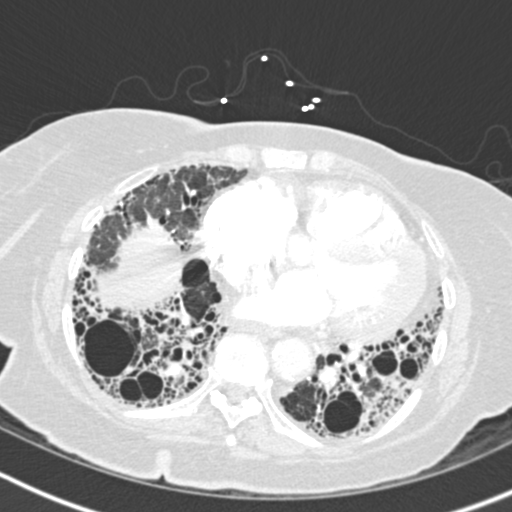
[im 117/259  mediastinal]
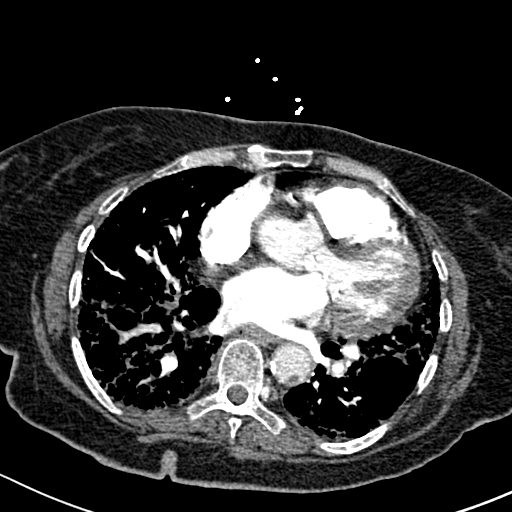
[im 130/259  lung]
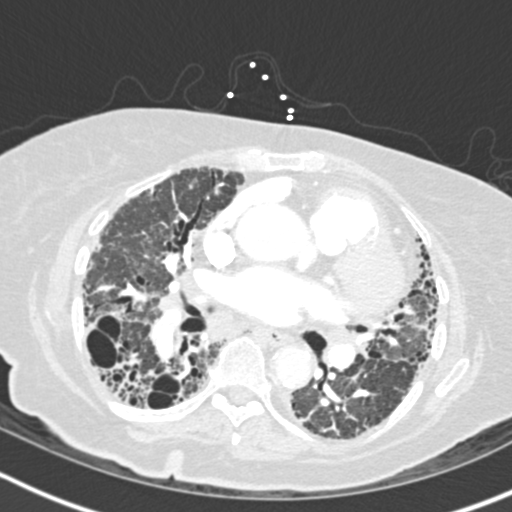
[im 142/259  mediastinal]
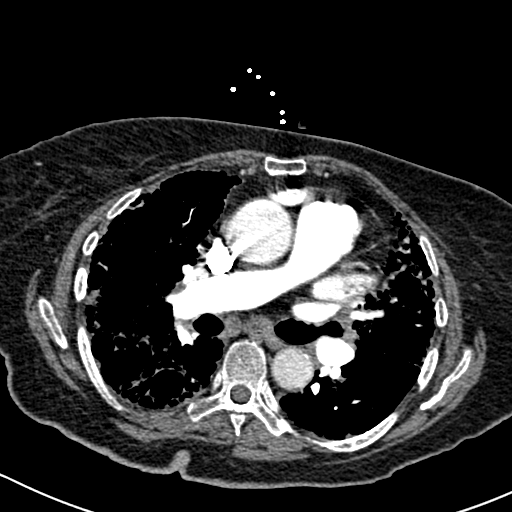
[im 155/259  lung]
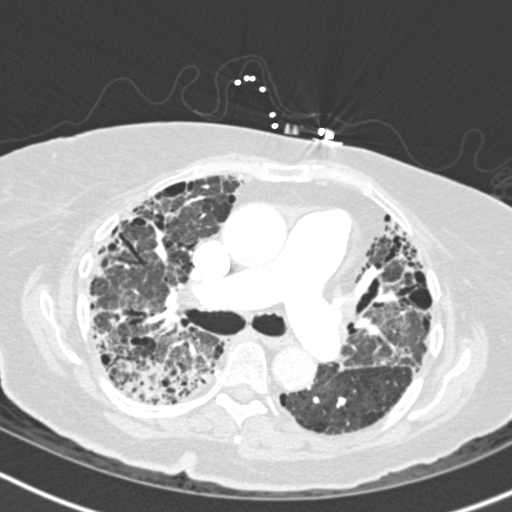
[im 168/259  mediastinal]
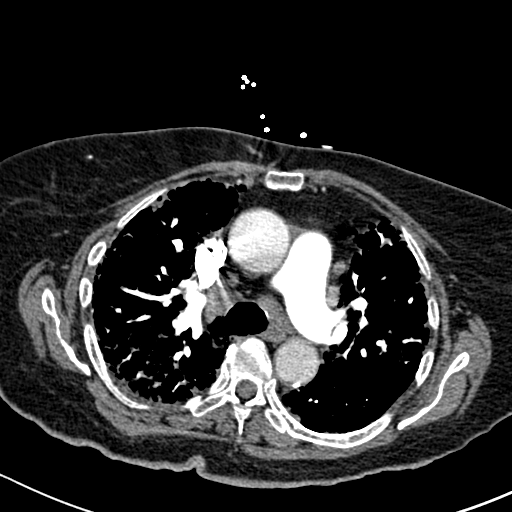
[im 181/259  lung]
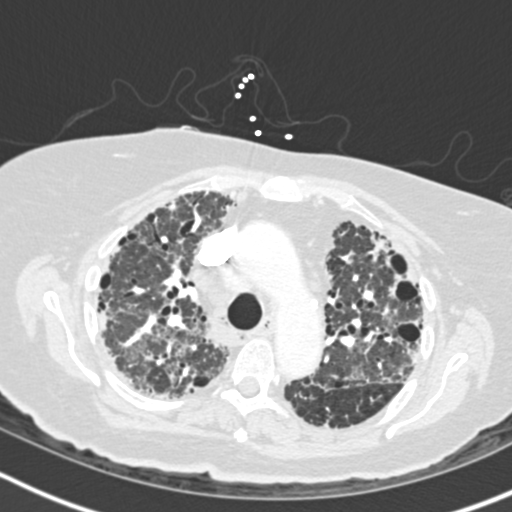
[im 207/259  mediastinal]
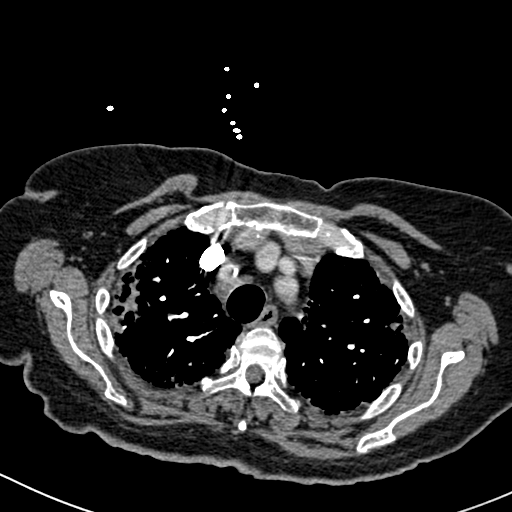
[im 220/259  lung]
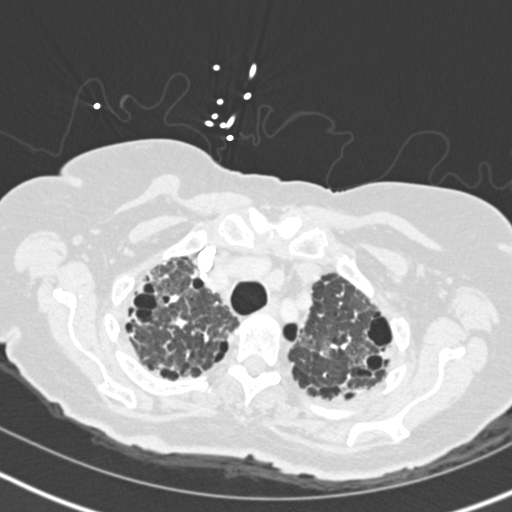
[im 233/259  mediastinal]
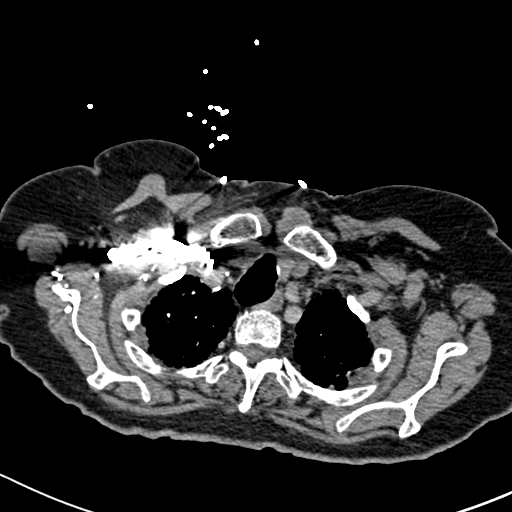
[im 246/259  lung]
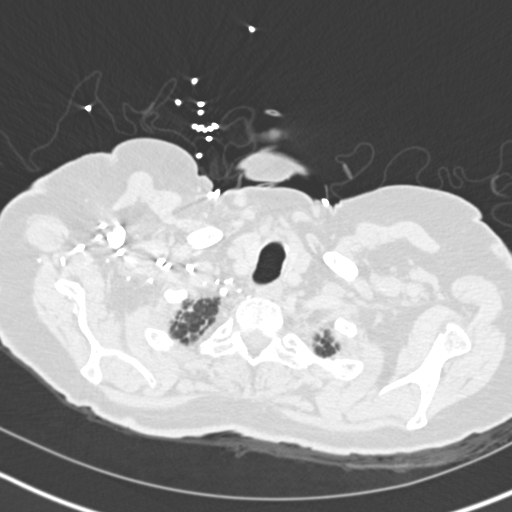

[Series 8: coronal mpr · coronal · 0.50mm/px · 1 of 113 slices shown]
[im 57/113  mediastinal]
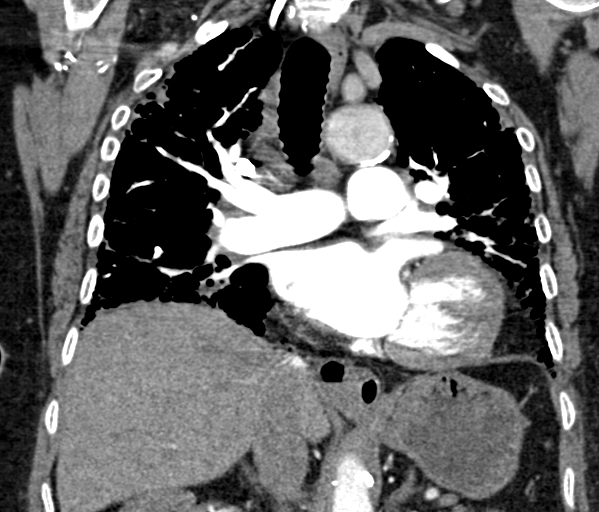

[18 of 36 positions shown; findings below may reference images not displayed]

FINDINGS: Cardiovascular: Cardiomegaly is noted. No pericardial effusion. No
pulmonary embolus is noted. There is no aortic aneurysm.
Atherosclerotic calcifications of thoracic aorta and coronary
arteries. Small hiatal hernia.

Mediastinum/Nodes: A precarinal lymph node Measures 1.1 cm
short-axis. AP window lymph node measures 1.2 cm short-axis. Left
anterior mediastinal lymph node just lateral to pulmonary artery
measures 1 cm short-axis. Right hilar lymph node Measures 1 cm
short-axis. Left hilar lymph node Measures 1 cm short-axis. These
are borderline enlarged by size criteria probable reactive. Central
airways are patent.

Lungs/Pleura: There is bilateral chronic interstitial lung disease
with significant cystic changes and peribronchovascular interstitial
thickening. Peripheral fibrotic changes with honeycombing are noted
bilateral upper lobes and lower lobes. There is thickening of
interlobular septa bilateral peripheral. No definite superimposed
infiltrate or pulmonary edema. Findings probable due to chronic UIP
and fibrotic changes.

Upper Abdomen: The visualized upper abdomen shows no adrenal gland
mass. Visualized upper kidneys are unremarkable. Liver and spleen
are unremarkable.

Musculoskeletal: No destructive bony lesions are noted. Sagittal
images of the spine are mild degenerative changes mid and lower
thoracic spine

Review of the MIP images confirms the above findings.
IMPRESSION: 1. No pulmonary embolus is noted.
2. Probable chronic extensive interstitial lung disease bilaterally.
Bilateral cystic and fibrotic changes. No definite superimposed
acute infiltrate or pulmonary edema.
3. There is bilateral borderline mediastinal and hilar adenopathy
probable reactive in nature. No definite pathologic lymph nodes are
noted.
4. Cardiomegaly is noted. Atherosclerotic calcifications of thoracic
aorta and coronary arteries.

## 2018-06-08 ENCOUNTER — Telehealth: Payer: Self-pay | Admitting: Internal Medicine

## 2018-06-08 NOTE — Telephone Encounter (Signed)
Patient's status has been changed to deceased per MR. Nothing further needed.

## 2018-06-08 NOTE — Telephone Encounter (Signed)
Irving Burton  Please mark patient deceased  Thanks    SIGNATURE    Dr. Kalman Shan, M.D., F.C.C.P,  Pulmonary and Critical Care Medicine Staff Physician, [facility] Center Director - Interstitial Lung Disease  Program  Pulmonary Fibrosis [facility] at [facility] [place], [place], [address]  Pager: [phone], If no answer or between  15:00h - 7:00h: call [phone] Telephone: [phone]  1:35 PM 06/08/2018
